# Patient Record
Sex: Female | Born: 1970 | Race: Black or African American | Hispanic: No | Marital: Single | Smoking: Current every day smoker
Health system: Southern US, Community
[De-identification: ages and names within clinical notes are randomized; demographics above are authoritative.]

## PROBLEM LIST (undated history)

## (undated) DIAGNOSIS — Z972 Presence of dental prosthetic device (complete) (partial): Secondary | ICD-10-CM

## (undated) DIAGNOSIS — K219 Gastro-esophageal reflux disease without esophagitis: Secondary | ICD-10-CM

## (undated) DIAGNOSIS — Z86018 Personal history of other benign neoplasm: Secondary | ICD-10-CM

## (undated) DIAGNOSIS — R109 Unspecified abdominal pain: Secondary | ICD-10-CM

## (undated) DIAGNOSIS — M797 Fibromyalgia: Secondary | ICD-10-CM

## (undated) DIAGNOSIS — F411 Generalized anxiety disorder: Secondary | ICD-10-CM

## (undated) DIAGNOSIS — G43909 Migraine, unspecified, not intractable, without status migrainosus: Secondary | ICD-10-CM

## (undated) DIAGNOSIS — Z9889 Other specified postprocedural states: Secondary | ICD-10-CM

## (undated) DIAGNOSIS — R011 Cardiac murmur, unspecified: Secondary | ICD-10-CM

## (undated) DIAGNOSIS — K08109 Complete loss of teeth, unspecified cause, unspecified class: Secondary | ICD-10-CM

## (undated) HISTORY — PX: FIBEROPTIC BRONCHOSCOPY: SHX5367

## (undated) HISTORY — PX: CHOLECYSTECTOMY: SHX55

## (undated) HISTORY — PX: TUBAL LIGATION: SHX77

## (undated) HISTORY — PX: OPEN PARTIAL HEPATECTOMY [83]: SHX5987

## (undated) HISTORY — PX: LIVER SURGERY: SHX698

---

## 1993-01-24 HISTORY — PX: TUBAL LIGATION: SHX77

## 1997-05-14 ENCOUNTER — Other Ambulatory Visit: Admission: RE | Admit: 1997-05-14 | Discharge: 1997-05-14 | Payer: Self-pay | Admitting: Family Medicine

## 1997-06-27 ENCOUNTER — Other Ambulatory Visit: Admission: RE | Admit: 1997-06-27 | Discharge: 1997-06-27 | Payer: Self-pay | Admitting: Family Medicine

## 1997-08-06 ENCOUNTER — Other Ambulatory Visit: Admission: RE | Admit: 1997-08-06 | Discharge: 1997-08-06 | Payer: Self-pay | Admitting: Family Medicine

## 1997-08-19 ENCOUNTER — Ambulatory Visit (HOSPITAL_COMMUNITY): Admission: RE | Admit: 1997-08-19 | Discharge: 1997-08-19 | Payer: Self-pay | Admitting: Family Medicine

## 1999-01-07 ENCOUNTER — Emergency Department (HOSPITAL_COMMUNITY): Admission: EM | Admit: 1999-01-07 | Discharge: 1999-01-07 | Payer: Self-pay | Admitting: Emergency Medicine

## 1999-09-13 ENCOUNTER — Emergency Department (HOSPITAL_COMMUNITY): Admission: EM | Admit: 1999-09-13 | Discharge: 1999-09-13 | Payer: Self-pay | Admitting: Emergency Medicine

## 2000-09-28 ENCOUNTER — Emergency Department (HOSPITAL_COMMUNITY): Admission: EM | Admit: 2000-09-28 | Discharge: 2000-09-28 | Payer: Self-pay | Admitting: Emergency Medicine

## 2001-03-02 ENCOUNTER — Encounter: Payer: Self-pay | Admitting: Family Medicine

## 2001-03-02 ENCOUNTER — Encounter: Admission: RE | Admit: 2001-03-02 | Discharge: 2001-03-02 | Payer: Self-pay | Admitting: Family Medicine

## 2001-05-05 ENCOUNTER — Emergency Department (HOSPITAL_COMMUNITY): Admission: EM | Admit: 2001-05-05 | Discharge: 2001-05-06 | Payer: Self-pay | Admitting: Emergency Medicine

## 2002-10-07 ENCOUNTER — Emergency Department (HOSPITAL_COMMUNITY): Admission: EM | Admit: 2002-10-07 | Discharge: 2002-10-07 | Payer: Self-pay | Admitting: Emergency Medicine

## 2003-03-01 ENCOUNTER — Emergency Department (HOSPITAL_COMMUNITY): Admission: EM | Admit: 2003-03-01 | Discharge: 2003-03-01 | Payer: Self-pay | Admitting: Emergency Medicine

## 2003-11-12 ENCOUNTER — Emergency Department (HOSPITAL_COMMUNITY): Admission: EM | Admit: 2003-11-12 | Discharge: 2003-11-13 | Payer: Self-pay | Admitting: *Deleted

## 2004-02-11 ENCOUNTER — Emergency Department (HOSPITAL_COMMUNITY): Admission: EM | Admit: 2004-02-11 | Discharge: 2004-02-12 | Payer: Self-pay

## 2004-02-13 ENCOUNTER — Ambulatory Visit (HOSPITAL_COMMUNITY): Admission: RE | Admit: 2004-02-13 | Discharge: 2004-02-13 | Payer: Self-pay

## 2004-02-13 ENCOUNTER — Emergency Department (HOSPITAL_COMMUNITY): Admission: EM | Admit: 2004-02-13 | Discharge: 2004-02-13 | Payer: Self-pay | Admitting: Emergency Medicine

## 2004-03-12 ENCOUNTER — Ambulatory Visit: Payer: Self-pay | Admitting: Internal Medicine

## 2004-03-30 ENCOUNTER — Ambulatory Visit (HOSPITAL_COMMUNITY): Admission: RE | Admit: 2004-03-30 | Discharge: 2004-03-30 | Payer: Self-pay | Admitting: Thoracic Surgery

## 2004-03-30 ENCOUNTER — Encounter (INDEPENDENT_AMBULATORY_CARE_PROVIDER_SITE_OTHER): Payer: Self-pay | Admitting: Specialist

## 2004-04-19 ENCOUNTER — Encounter: Admission: RE | Admit: 2004-04-19 | Discharge: 2004-04-19 | Payer: Self-pay | Admitting: Occupational Medicine

## 2004-05-20 ENCOUNTER — Ambulatory Visit: Payer: Self-pay | Admitting: Critical Care Medicine

## 2004-07-01 ENCOUNTER — Ambulatory Visit: Payer: Self-pay | Admitting: Critical Care Medicine

## 2004-09-10 ENCOUNTER — Ambulatory Visit: Payer: Self-pay | Admitting: Critical Care Medicine

## 2004-10-07 ENCOUNTER — Ambulatory Visit: Payer: Self-pay | Admitting: Critical Care Medicine

## 2004-10-26 ENCOUNTER — Ambulatory Visit: Payer: Self-pay | Admitting: Internal Medicine

## 2004-11-04 ENCOUNTER — Ambulatory Visit: Payer: Self-pay | Admitting: Gastroenterology

## 2004-11-08 ENCOUNTER — Ambulatory Visit: Payer: Self-pay | Admitting: Internal Medicine

## 2004-11-10 ENCOUNTER — Ambulatory Visit: Payer: Self-pay | Admitting: Cardiology

## 2005-03-25 ENCOUNTER — Emergency Department (HOSPITAL_COMMUNITY): Admission: EM | Admit: 2005-03-25 | Discharge: 2005-03-25 | Payer: Self-pay | Admitting: Family Medicine

## 2005-06-30 ENCOUNTER — Emergency Department (HOSPITAL_COMMUNITY): Admission: EM | Admit: 2005-06-30 | Discharge: 2005-06-30 | Payer: Self-pay | Admitting: Family Medicine

## 2005-11-03 ENCOUNTER — Emergency Department (HOSPITAL_COMMUNITY): Admission: EM | Admit: 2005-11-03 | Discharge: 2005-11-03 | Payer: Self-pay | Admitting: Family Medicine

## 2005-12-14 ENCOUNTER — Emergency Department (HOSPITAL_COMMUNITY): Admission: EM | Admit: 2005-12-14 | Discharge: 2005-12-14 | Payer: Self-pay | Admitting: Emergency Medicine

## 2006-01-05 ENCOUNTER — Emergency Department (HOSPITAL_COMMUNITY): Admission: EM | Admit: 2006-01-05 | Discharge: 2006-01-06 | Payer: Self-pay | Admitting: Emergency Medicine

## 2006-01-26 ENCOUNTER — Emergency Department (HOSPITAL_COMMUNITY): Admission: EM | Admit: 2006-01-26 | Discharge: 2006-01-26 | Payer: Self-pay | Admitting: Emergency Medicine

## 2006-03-07 ENCOUNTER — Ambulatory Visit (HOSPITAL_COMMUNITY): Admission: RE | Admit: 2006-03-07 | Discharge: 2006-03-07 | Payer: Self-pay | Admitting: Obstetrics

## 2006-10-16 ENCOUNTER — Emergency Department (HOSPITAL_COMMUNITY): Admission: EM | Admit: 2006-10-16 | Discharge: 2006-10-16 | Payer: Self-pay | Admitting: Family Medicine

## 2007-03-27 ENCOUNTER — Emergency Department (HOSPITAL_COMMUNITY): Admission: EM | Admit: 2007-03-27 | Discharge: 2007-03-27 | Payer: Self-pay | Admitting: Emergency Medicine

## 2007-04-10 ENCOUNTER — Ambulatory Visit (HOSPITAL_COMMUNITY): Admission: RE | Admit: 2007-04-10 | Discharge: 2007-04-10 | Payer: Self-pay | Admitting: Gastroenterology

## 2008-10-23 ENCOUNTER — Emergency Department (HOSPITAL_COMMUNITY): Admission: EM | Admit: 2008-10-23 | Discharge: 2008-10-23 | Payer: Self-pay | Admitting: Family Medicine

## 2008-11-15 IMAGING — NM NM HEPATO W/GB/PHARM/[PERSON_NAME]
3 series · 13 of 13 positions shown · non-contrast
Comparison: none

CLINICAL DATA: Hepatic hemangioma. Abdominal pain.

[Series 1: he hepatobiliary · 1 of 1 slices shown (1 of 3)]
[im 1/1]
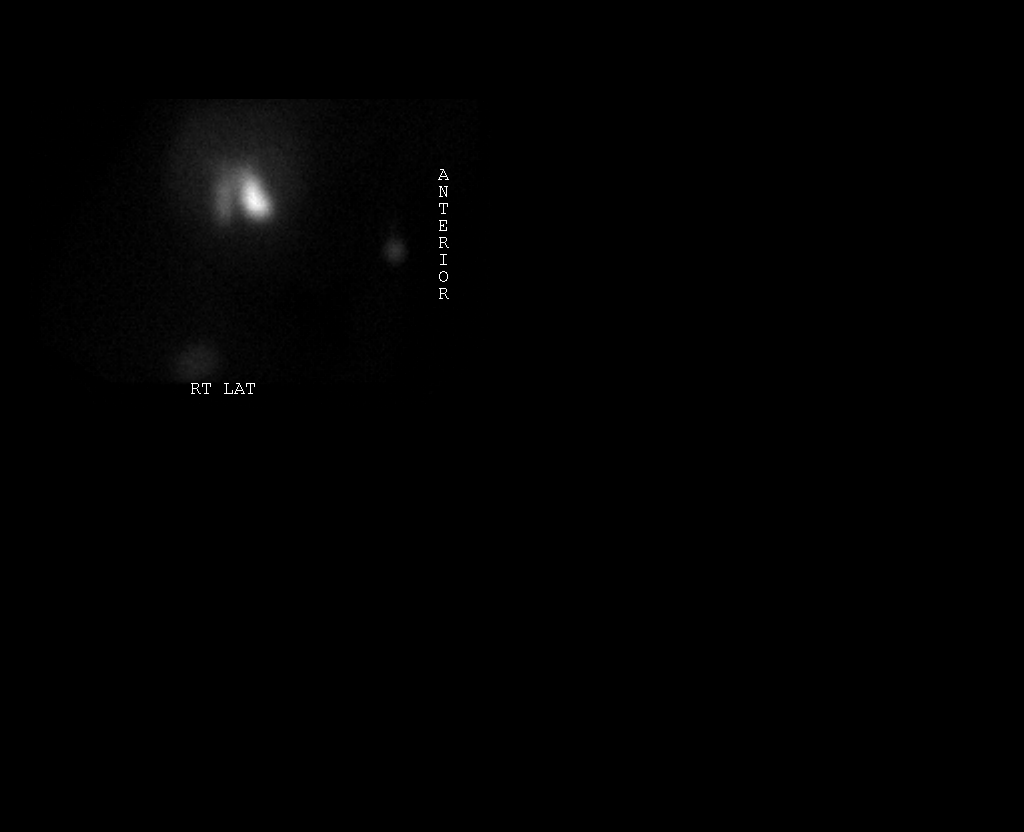

[Series 1: he hepatobiliary · 3.22mm/px · 6 of 51 frames shown (2 of 3)]
[frame 5/51]
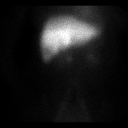
[frame 13/51]
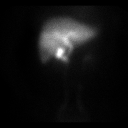
[frame 22/51]
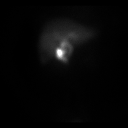
[frame 30/51]
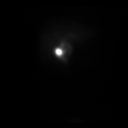
[frame 39/51]
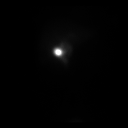
[frame 47/51]
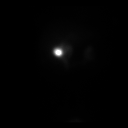

[Series 1: he hepatobiliary · 3.22mm/px · 6 of 12 frames shown (3 of 3)]
[frame 2/12]
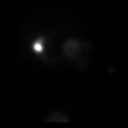
[frame 4/12]
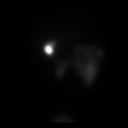
[frame 6/12]
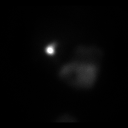
[frame 8/12]
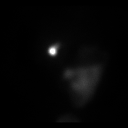
[frame 10/12]
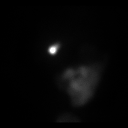
[frame 12/12]
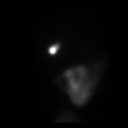

[13 of 13 positions shown; findings below may reference images not displayed]

Hepatobiliary scintigraphy with ejection fraction:

Anterior imaging after 5.3 mCi Ac00P Choletec IV. There is prompt clearance of
the radiopharmaceutical from the blood pool. Timely visualization of activity in
central bile ducts, small bowel, and gallbladder. 
After 1 hour, patient ingested 8 oz half-and-half p.o. The calculated
gallbladder ejection fraction over 60 minutes is  60%.
IMPRESSION: 1. Patency of cystic and common bile ducts.
2. Normal gallbladder ejection fraction.

## 2009-05-06 ENCOUNTER — Emergency Department (HOSPITAL_COMMUNITY): Admission: EM | Admit: 2009-05-06 | Discharge: 2009-05-06 | Payer: Self-pay | Admitting: Emergency Medicine

## 2009-07-07 ENCOUNTER — Emergency Department (HOSPITAL_COMMUNITY): Admission: EM | Admit: 2009-07-07 | Discharge: 2009-07-08 | Payer: Self-pay | Admitting: Emergency Medicine

## 2010-02-14 ENCOUNTER — Encounter: Payer: Self-pay | Admitting: Gastroenterology

## 2010-02-14 ENCOUNTER — Encounter: Payer: Self-pay | Admitting: Obstetrics

## 2010-03-11 ENCOUNTER — Inpatient Hospital Stay (INDEPENDENT_AMBULATORY_CARE_PROVIDER_SITE_OTHER)
Admission: RE | Admit: 2010-03-11 | Discharge: 2010-03-11 | Disposition: A | Discharge status: Home / Self Care | Payer: Self-pay | Source: Ambulatory Visit | Attending: Family Medicine | Admitting: Family Medicine

## 2010-03-11 DIAGNOSIS — R51 Headache: Secondary | ICD-10-CM

## 2010-04-12 LAB — POCT I-STAT, CHEM 8
Calcium, Ion: 1.06 mmol/L — ABNORMAL LOW (ref 1.12–1.32)
Glucose, Bld: 87 mg/dL (ref 70–99)
HCT: 42 % (ref 36.0–46.0)
Hemoglobin: 14.3 g/dL (ref 12.0–15.0)
Potassium: 3.5 mEq/L (ref 3.5–5.1)

## 2010-04-12 LAB — CK: Total CK: 160 U/L (ref 7–177)

## 2010-04-14 LAB — DIFFERENTIAL
Basophils Relative: 0 % (ref 0–1)
Eosinophils Absolute: 0 10*3/uL (ref 0.0–0.7)
Monocytes Absolute: 0.1 10*3/uL (ref 0.1–1.0)
Monocytes Relative: 2 % — ABNORMAL LOW (ref 3–12)

## 2010-04-14 LAB — COMPREHENSIVE METABOLIC PANEL
ALT: 17 U/L (ref 0–35)
Albumin: 3.9 g/dL (ref 3.5–5.2)
Alkaline Phosphatase: 75 U/L (ref 39–117)
Potassium: 3.6 mEq/L (ref 3.5–5.1)
Sodium: 138 mEq/L (ref 135–145)
Total Protein: 7.5 g/dL (ref 6.0–8.3)

## 2010-04-14 LAB — CBC
Hemoglobin: 14.1 g/dL (ref 12.0–15.0)
Platelets: 272 10*3/uL (ref 150–400)
RDW: 14.7 % (ref 11.5–15.5)

## 2010-04-30 LAB — COMPREHENSIVE METABOLIC PANEL
ALT: 11 U/L (ref 0–35)
AST: 23 U/L (ref 0–37)
Albumin: 3.5 g/dL (ref 3.5–5.2)
Alkaline Phosphatase: 61 U/L (ref 39–117)
GFR calc Af Amer: >60 mL/min (ref >60)
Glucose, Bld: 84 mg/dL (ref 70–99)
Potassium: 3.6 mEq/L (ref 3.5–5.1)
Sodium: 136 mEq/L (ref 135–145)
Total Protein: 6.8 g/dL (ref 6.0–8.3)

## 2010-04-30 LAB — POCT URINALYSIS DIP (DEVICE)
Nitrite: NEGATIVE
Urobilinogen, UA: 0.2 mg/dL (ref 0.0–1.0)
pH: 5.5 (ref 5.0–8.0)

## 2010-04-30 LAB — WET PREP, GENITAL: WBC, Wet Prep HPF POC: NONE SEEN

## 2010-04-30 LAB — DIFFERENTIAL
Basophils Absolute: 0 10*3/uL (ref 0.0–0.1)
Basophils Relative: 1 % (ref 0–1)
Eosinophils Relative: 2 % (ref 0–5)
Monocytes Absolute: 0.4 10*3/uL (ref 0.1–1.0)

## 2010-04-30 LAB — CBC
Hemoglobin: 12.8 g/dL (ref 12.0–15.0)
RBC: 3.68 MIL/uL — ABNORMAL LOW (ref 3.87–5.11)
RDW: 14.1 % (ref 11.5–15.5)

## 2010-04-30 LAB — GC/CHLAMYDIA PROBE AMP, GENITAL: Chlamydia, DNA Probe: NEGATIVE

## 2010-06-11 NOTE — Op Note (Signed)
NAMEARLETTE, SCHAAD                ACCOUNT NO.:  [id number]   MEDICAL RECORD NO.:  [id number]          PATIENT TYPE:  OIB   LOCATION:  2899                         FACILITY:  [facility]   PHYSICIAN:  Ines Bloomer, M.D. DATE OF BIRTH:  1970-06-25   DATE OF PROCEDURE:  03/30/2004  DATE OF DISCHARGE:                                 OPERATIVE REPORT   PREOPERATIVE DIAGNOSIS:  Mediastinal axillary and supraclavicular  adenopathy.   POSTOPERATIVE DIAGNOSIS:  Mediastinal axillary and supraclavicular  adenopathy, possible sarcoidosis.   OPERATION PERFORMED:  Fiberoptic bronchoscopy and left supraclavicular node  biopsy.   SURGEON:  Ines Bloomer, M.D.   ANESTHESIA:  General.   DESCRIPTION OF PROCEDURE:  After general anesthesia, the video bronchoscope  was passed through the endotracheal tube.  The carina was in midline.  The  right upper lobe, right middle lobe and right lower lobe orifices were  normal.  The left mainstem, left upper lobe and left lower lobe orifices  were normal.  Washings and cultures were taken from the bronchial washings.  Then the neck was prepped and draped in the usual sterile manner.  A  transverse incision was made above the left supraclavicular area and the  anterior cervical triangle and dissection carried down lateral to the  jugular splitting the sternocleidomastoid and there was a large amount of  nodes there which were excised with electrocautery and sharp and blunt  dissection.  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  Wounds were  closed with 2-0 Vicryl in the muscle area and 3-0 Vicryl in the subcutaneous  tissue and Dermabond for the skin.  The patient tolerated the procedure  well, returned to recovery room in stable condition.      DPB/MEDQ  D:  03/30/2004  T:  03/30/2004  Job:  [id number]

## 2010-06-11 NOTE — Letter (Signed)
March 12, 2006    Julie Valentine  [address]  [place], [place] [address]   RE:  [name], [name]  MRN:  [id number]  /  DOB:  11-10-1970   Dear Ms. Gurski:   You have scheduled appointments with me four times since November of  2006, but have failed to show for any of them.  I realize you have  significant medical problems and I would like to try to continue to help  you.   However, if you cannot keep your appointments we cannot really have an  effective patient-physician relationship.  I would ask that you make  another appointment with me if you are having problems that need my  evaluation.  If you fail to keep that appointment, I will plan to  discharge you from my practice due to noncompliance manifested by  failure to show up for appointments.    Sincerely,      Iva Boop, MD,FACG  Electronically Signed    CEG/MedQ  DD: 03/12/2006  DT: 03/12/2006  Job #: [id number]

## 2010-06-11 NOTE — Letter (Signed)
April 03, 2006    Ms Julie Valentine  [address]  [place], [place] [address]   RE:  [name], [name]  MRN:  [id number]  /  DOB:  1970/09/22   Dear [name]:   You recently missed yet another appointment that you had scheduled with  me. Unfortunately, due to multiple no shows, I am discharging you from  my practice. I will be available for emergencies over the next 30 days,  but due to your non-compliance with office visits, I think we are unable  to have an effective patient-physician relationship.    Sincerely,      Iva Boop, MD,FACG  Electronically Signed    CEG/MedQ  DD: 04/02/2006  DT: 04/03/2006  Job #: [id number]

## 2010-10-20 ENCOUNTER — Other Ambulatory Visit (HOSPITAL_COMMUNITY): Payer: Self-pay | Admitting: Obstetrics

## 2010-10-20 DIAGNOSIS — Z1231 Encounter for screening mammogram for malignant neoplasm of breast: Secondary | ICD-10-CM

## 2010-10-25 ENCOUNTER — Ambulatory Visit (HOSPITAL_COMMUNITY)
Admission: RE | Admit: 2010-10-25 | Discharge: 2010-10-25 | Disposition: A | Discharge status: Home / Self Care | Payer: Medicaid Other | Source: Ambulatory Visit | Attending: Obstetrics | Admitting: Obstetrics

## 2010-10-25 DIAGNOSIS — Z1231 Encounter for screening mammogram for malignant neoplasm of breast: Secondary | ICD-10-CM | POA: Insufficient documentation

## 2010-10-29 ENCOUNTER — Other Ambulatory Visit: Payer: Self-pay | Admitting: Obstetrics

## 2010-10-29 DIAGNOSIS — R928 Other abnormal and inconclusive findings on diagnostic imaging of breast: Secondary | ICD-10-CM

## 2010-11-13 ENCOUNTER — Emergency Department (HOSPITAL_COMMUNITY)
Admission: EM | Admit: 2010-11-13 | Discharge: 2010-11-14 | Disposition: A | Discharge status: Home / Self Care | Payer: Medicaid Other | Attending: Emergency Medicine | Admitting: Emergency Medicine

## 2010-11-13 DIAGNOSIS — F329 Major depressive disorder, single episode, unspecified: Secondary | ICD-10-CM | POA: Insufficient documentation

## 2010-11-13 DIAGNOSIS — K219 Gastro-esophageal reflux disease without esophagitis: Secondary | ICD-10-CM | POA: Insufficient documentation

## 2010-11-13 DIAGNOSIS — R079 Chest pain, unspecified: Secondary | ICD-10-CM | POA: Insufficient documentation

## 2010-11-13 DIAGNOSIS — F411 Generalized anxiety disorder: Secondary | ICD-10-CM | POA: Insufficient documentation

## 2010-11-13 DIAGNOSIS — F3289 Other specified depressive episodes: Secondary | ICD-10-CM | POA: Insufficient documentation

## 2010-11-13 DIAGNOSIS — K209 Esophagitis, unspecified without bleeding: Secondary | ICD-10-CM | POA: Insufficient documentation

## 2010-11-14 ENCOUNTER — Emergency Department (HOSPITAL_COMMUNITY): Payer: Medicaid Other

## 2010-11-14 LAB — COMPREHENSIVE METABOLIC PANEL
Albumin: 4.1 g/dL (ref 3.5–5.2)
Alkaline Phosphatase: 82 U/L (ref 39–117)
BUN: 8 mg/dL (ref 6–23)
Potassium: 4 mEq/L (ref 3.5–5.1)
Sodium: 138 mEq/L (ref 135–145)
Total Protein: 8.4 g/dL — ABNORMAL HIGH (ref 6.0–8.3)

## 2010-11-14 LAB — TROPONIN I
Troponin I: <0.3 ng/mL (ref <0.30)
Troponin I: <0.3 ng/mL (ref <0.30)

## 2010-11-14 LAB — CBC
HCT: 39.5 % (ref 36.0–46.0)
MCHC: 34.2 g/dL (ref 30.0–36.0)
Platelets: 349 10*3/uL (ref 150–400)
RDW: 13.7 % (ref 11.5–15.5)

## 2010-11-14 LAB — CK TOTAL AND CKMB (NOT AT ARMC)
CK, MB: 1.4 ng/mL (ref 0.3–4.0)
Total CK: 110 U/L (ref 7–177)
Total CK: 86 U/L (ref 7–177)

## 2010-11-14 LAB — DIFFERENTIAL
Basophils Absolute: 0.1 10*3/uL (ref 0.0–0.1)
Basophils Relative: 1 % (ref 0–1)
Eosinophils Absolute: 0.2 10*3/uL (ref 0.0–0.7)
Eosinophils Relative: 3 % (ref 0–5)
Monocytes Absolute: 0.4 10*3/uL (ref 0.1–1.0)

## 2010-11-14 LAB — URINALYSIS, ROUTINE W REFLEX MICROSCOPIC
Ketones, ur: NEGATIVE mg/dL
Urobilinogen, UA: 0.2 mg/dL (ref 0.0–1.0)
pH: 6 (ref 5.0–8.0)

## 2010-12-06 ENCOUNTER — Other Ambulatory Visit: Payer: Self-pay | Admitting: Obstetrics

## 2010-12-06 ENCOUNTER — Ambulatory Visit
Admission: RE | Admit: 2010-12-06 | Discharge: 2010-12-06 | Disposition: A | Discharge status: Home / Self Care | Payer: Medicaid Other | Source: Ambulatory Visit | Attending: Obstetrics | Admitting: Obstetrics

## 2010-12-06 DIAGNOSIS — R928 Other abnormal and inconclusive findings on diagnostic imaging of breast: Secondary | ICD-10-CM

## 2011-04-12 ENCOUNTER — Encounter (HOSPITAL_COMMUNITY): Payer: Self-pay

## 2011-04-12 ENCOUNTER — Inpatient Hospital Stay (HOSPITAL_COMMUNITY): Payer: Medicaid Other

## 2011-04-12 ENCOUNTER — Encounter (HOSPITAL_COMMUNITY): Payer: Self-pay | Admitting: *Deleted

## 2011-04-12 ENCOUNTER — Emergency Department (HOSPITAL_COMMUNITY)
Admission: EM | Admit: 2011-04-12 | Discharge: 2011-04-12 | Disposition: A | Discharge status: Home / Self Care | Payer: Medicaid Other | Source: Home / Self Care | Attending: Emergency Medicine | Admitting: Emergency Medicine

## 2011-04-12 ENCOUNTER — Inpatient Hospital Stay (HOSPITAL_COMMUNITY)
Admission: AD | Admit: 2011-04-12 | Discharge: 2011-04-12 | Disposition: A | Discharge status: Home / Self Care | Payer: Medicaid Other | Source: Ambulatory Visit | Attending: Obstetrics | Admitting: Obstetrics

## 2011-04-12 DIAGNOSIS — D219 Benign neoplasm of connective and other soft tissue, unspecified: Secondary | ICD-10-CM

## 2011-04-12 DIAGNOSIS — N949 Unspecified condition associated with female genital organs and menstrual cycle: Secondary | ICD-10-CM

## 2011-04-12 DIAGNOSIS — N739 Female pelvic inflammatory disease, unspecified: Secondary | ICD-10-CM | POA: Insufficient documentation

## 2011-04-12 DIAGNOSIS — N939 Abnormal uterine and vaginal bleeding, unspecified: Secondary | ICD-10-CM

## 2011-04-12 DIAGNOSIS — R102 Pelvic and perineal pain: Secondary | ICD-10-CM

## 2011-04-12 DIAGNOSIS — N730 Acute parametritis and pelvic cellulitis: Secondary | ICD-10-CM

## 2011-04-12 DIAGNOSIS — R109 Unspecified abdominal pain: Secondary | ICD-10-CM | POA: Insufficient documentation

## 2011-04-12 DIAGNOSIS — B9689 Other specified bacterial agents as the cause of diseases classified elsewhere: Secondary | ICD-10-CM | POA: Insufficient documentation

## 2011-04-12 DIAGNOSIS — D259 Leiomyoma of uterus, unspecified: Secondary | ICD-10-CM

## 2011-04-12 DIAGNOSIS — N760 Acute vaginitis: Secondary | ICD-10-CM

## 2011-04-12 DIAGNOSIS — N898 Other specified noninflammatory disorders of vagina: Secondary | ICD-10-CM

## 2011-04-12 DIAGNOSIS — A499 Bacterial infection, unspecified: Secondary | ICD-10-CM

## 2011-04-12 LAB — URINALYSIS, ROUTINE W REFLEX MICROSCOPIC
Bilirubin Urine: NEGATIVE
Hgb urine dipstick: NEGATIVE
Specific Gravity, Urine: 1.015 (ref 1.005–1.030)
pH: 7 (ref 5.0–8.0)

## 2011-04-12 LAB — CBC
Hemoglobin: 12.5 g/dL (ref 12.0–15.0)
MCH: 33.1 pg (ref 26.0–34.0)
MCV: 97.4 fL (ref 78.0–100.0)
RBC: 3.78 MIL/uL — ABNORMAL LOW (ref 3.87–5.11)
WBC: 4.1 10*3/uL (ref 4.0–10.5)

## 2011-04-12 LAB — POCT PREGNANCY, URINE: Preg Test, Ur: NEGATIVE

## 2011-04-12 MED ORDER — METRONIDAZOLE 500 MG PO TABS: 500.0000 mg | ORAL_TABLET | Freq: Two times a day (BID) | ORAL | Status: AC

## 2011-04-12 MED ORDER — KETOROLAC TROMETHAMINE 60 MG/2ML IM SOLN
60.0000 mg | Freq: Once | INTRAMUSCULAR | Status: AC
  Administered 2011-04-12: 60 mg via INTRAMUSCULAR
  Filled 2011-04-12: qty 2

## 2011-04-12 MED ORDER — AZITHROMYCIN 250 MG PO TABS
1000.0000 mg | ORAL_TABLET | Freq: Once | ORAL | Status: AC
  Administered 2011-04-12: 1000 mg via ORAL
  Filled 2011-04-12: qty 4

## 2011-04-12 MED ORDER — OXYCODONE-ACETAMINOPHEN 5-325 MG PO TABS
2.0000 | ORAL_TABLET | ORAL | Status: AC | PRN
Start: 2011-04-12 — End: 2011-04-22

## 2011-04-12 MED ORDER — HYDROCODONE-ACETAMINOPHEN 5-325 MG PO TABS
ORAL_TABLET | ORAL | Status: AC
  Filled 2011-04-12: qty 1

## 2011-04-12 MED ORDER — HYDROCODONE-ACETAMINOPHEN 5-325 MG PO TABS
ORAL_TABLET | ORAL | Status: AC
  Filled 2011-04-12: qty 2

## 2011-04-12 MED ORDER — CEFTRIAXONE SODIUM 250 MG IJ SOLR
250.0000 mg | Freq: Once | INTRAMUSCULAR | Status: AC
  Administered 2011-04-12: 250 mg via INTRAMUSCULAR
  Filled 2011-04-12: qty 250

## 2011-04-12 MED ORDER — DOXYCYCLINE HYCLATE 100 MG PO TABS: 100.0000 mg | ORAL_TABLET | Freq: Two times a day (BID) | ORAL | Status: AC

## 2011-04-12 MED ORDER — HYDROCODONE-ACETAMINOPHEN 5-325 MG PO TABS
2.0000 | ORAL_TABLET | Freq: Once | ORAL | Status: AC
  Administered 2011-04-12: 2 via ORAL

## 2011-04-12 NOTE — ED Notes (Signed)
C/o low abdominal and back pain with nausea.  States worse on lt than rt.  Denies urinary sx, vaginal sx, diarrhea or vomitiing.  States she has had 18 days straight of vaginal bleeding that decreased to spotting yesterday and has totally stopped today.  Reports she has been experiencing discomfort but the pain became so much worse since the bleeding has ceased.  Reports Dr Gaynell Face advised her to go to ED last night but she declined- called them again today and they told her to come here.

## 2011-04-12 NOTE — MAU Note (Signed)
States bled for 18 days with last period. Now spotting again.

## 2011-04-12 NOTE — ED Provider Notes (Signed)
History     CSN: [id number]  Arrival date & time 04/12/11  1002   First MD Initiated Contact with Patient 04/12/11 1053      Chief Complaint  Patient presents with  . Abdominal Pain  . Back Pain    (Consider location/radiation/quality/duration/timing/severity/associated sxs/prior treatment) HPI Comments: Patient presents urgent care and she is having severe left lower abdominal pain for the last couple of days it has been much worse since yesterday. She described that she's been having an ongoing straight vaginal bleeding for about 18 days and yesterday was fairly spotting and it has stopped today but pain has intensified on her left lower pelvic region. Patient call Dr. Gaynell Face last night was instructed to present herself to the emergency department she was having intense pain. Patient also describes that she was feeling tired decided not to go and cold this morning again and was instructed palpation by Dr. Elsie Stain nurse to present herself here at urgent care for further evaluation of her pain.  Patient denies any fevers, vomiting, diarrheas.  Patient is a 41 y.o. female presenting with abdominal pain and back pain. The history is provided by the patient.  Abdominal Pain The primary symptoms of the illness include abdominal pain, nausea and vaginal bleeding. The primary symptoms of the illness do not include fever, fatigue, vomiting, diarrhea, dysuria or vaginal discharge. The current episode started more than 2 days ago. The problem has been gradually worsening.  The patient states that she believes she is currently not pregnant. The patient has not had a change in bowel habit. Additional symptoms associated with the illness include anorexia and back pain. Symptoms associated with the illness do not include urgency, hematuria or frequency. Significant associated medical issues do not include substance abuse, diverticulitis or HIV.  Back Pain  Associated symptoms include abdominal pain.  Pertinent negatives include no fever and no dysuria.    History reviewed. No pertinent past medical history.  Past Surgical History  Procedure Date  . Liver surgery     History reviewed. No pertinent family history.  History  Substance Use Topics  . Smoking status: Current Everyday Smoker  . Smokeless tobacco: Not on file  . Alcohol Use: Yes    OB History    Grav Para Term Preterm Abortions TAB SAB Ect Mult Living                  Review of Systems  Constitutional: Negative for fever and fatigue.  Gastrointestinal: Positive for nausea, abdominal pain and anorexia. Negative for vomiting and diarrhea.  Genitourinary: Positive for vaginal bleeding. Negative for dysuria, urgency, frequency, hematuria and vaginal discharge.  Musculoskeletal: Positive for back pain.    Allergies  Review of patient's allergies indicates no known allergies.  Home Medications  No current outpatient prescriptions on file.  BP 114/81  Pulse 92  Temp(Src) 98.1 F (36.7 C) (Oral)  Resp 19  SpO2 98%  LMP 03/24/2011  Physical Exam  Nursing note and vitals reviewed. Constitutional: She appears well-developed and well-nourished. No distress.  HENT:  Head: Normocephalic.  Eyes: Conjunctivae are normal.  Abdominal: Soft. She exhibits no mass. There is tenderness. There is no rebound and no guarding. Hernia confirmed negative in the right inguinal area and confirmed negative in the left inguinal area.  Genitourinary: There is no rash, tenderness, lesion or injury on the right labia. There is no rash, tenderness, lesion or injury on the left labia. Uterus is enlarged and tender. Cervix exhibits no discharge and  no friability. Left adnexum displays tenderness and fullness. Left adnexum displays no mass. No tenderness or bleeding around the vagina. Vaginal discharge found.    ED Course  Procedures (including critical care time)   Labs Reviewed  POCT PREGNANCY, URINE  GC/CHLAMYDIA PROBE AMP,  GENITAL  WET PREP, GENITAL   No results found.   1. Pelvic pain   2. Vaginal bleeding, abnormal       MDM  Patient has been experiencing lower pelvic pains and metromenorrhagia for about 18 days. Vaginal bleeding has subsided with within the last 48 hours but left adnexal pain and fullness was palpated on pelvic exam. Patient has been indicated to present herself to the MAU at once. I have called preemptively alerting staff discuss case with nurse practitioner rationale for transfer and that I would alert Dr. Gaynell Face that she is on her way to have a transvaginal ultrasound.        Jimmie Molly, MD 04/12/11 1241

## 2011-04-12 NOTE — MAU Note (Signed)
Abdominal pain, all across lower part, but more to left since yesterday. Went to urgent care. Had  -UPT, did wet prep. Advised to come to MAU. States has had BTL.

## 2011-04-12 NOTE — Discharge Instructions (Signed)
Bacterial Vaginosis Bacterial vaginosis (BV) is a vaginal infection where the normal balance of bacteria in the vagina is disrupted. The normal balance is then replaced by an overgrowth of certain bacteria. There are several different kinds of bacteria that can cause BV. BV is the most common vaginal infection in women of childbearing age. CAUSES   The cause of BV is not fully understood. BV develops when there is an increase or imbalance of harmful bacteria.   Some activities or behaviors can upset the normal balance of bacteria in the vagina and put women at increased risk including:   Having a new sex partner or multiple sex partners.   Douching.   Using an intrauterine device (IUD) for contraception.   It is not clear what role sexual activity plays in the development of BV. However, women that have never had sexual intercourse are rarely infected with BV.  Women do not get BV from toilet seats, bedding, swimming pools or from touching objects around them.  SYMPTOMS   Grey vaginal discharge.   A fish-like odor with discharge, especially after sexual intercourse.   Itching or burning of the vagina and vulva.   Burning or pain with urination.   Some women have no signs or symptoms at all.  DIAGNOSIS  Your caregiver must examine the vagina for signs of BV. Your caregiver will perform lab tests and look at the sample of vaginal fluid through a microscope. They will look for bacteria and abnormal cells (clue cells), a pH test higher than 4.5, and a positive amine test all associated with BV.  RISKS AND COMPLICATIONS   Pelvic inflammatory disease (PID).   Infections following gynecology surgery.   Developing HIV.   Developing herpes virus.  TREATMENT  Sometimes BV will clear up without treatment. However, all women with symptoms of BV should be treated to avoid complications, especially if gynecology surgery is planned. Female partners generally do not need to be treated. However,  BV may spread between female sex partners so treatment is helpful in preventing a recurrence of BV.   BV may be treated with antibiotics. The antibiotics come in either pill or vaginal cream forms. Either can be used with nonpregnant or pregnant women, but the recommended dosages differ. These antibiotics are not harmful to the baby.   BV can recur after treatment. If this happens, a second round of antibiotics will often be prescribed.   Treatment is important for pregnant women. If not treated, BV can cause a premature delivery, especially for a pregnant woman who had a premature birth in the past. All pregnant women who have symptoms of BV should be checked and treated.   For chronic reoccurrence of BV, treatment with a type of prescribed gel vaginally twice a week is helpful.  HOME CARE INSTRUCTIONS   Finish all medication as directed by your caregiver.   Do not have sex until treatment is completed.   Tell your sexual partner that you have a vaginal infection. They should see their caregiver and be treated if they have problems, such as a mild rash or itching.   Practice safe sex. Use condoms. Only have 1 sex partner.  PREVENTION  Basic prevention steps can help reduce the risk of upsetting the natural balance of bacteria in the vagina and developing BV:  Do not have sexual intercourse (be abstinent).   Do not douche.   Use all of the medicine prescribed for treatment of BV, even if the signs and symptoms go away.     Tell your sex partner if you have BV. That way, they can be treated, if needed, to prevent reoccurrence.  SEEK MEDICAL CARE IF:   Your symptoms are not improving after 3 days of treatment.   You have increased discharge, pain, or fever.  MAKE SURE YOU:   Understand these instructions.   Will watch your condition.   Will get help right away if you are not doing well or get worse.  FOR MORE INFORMATION  Division of STD Prevention (DSTDP), Centers for Disease  Control and Prevention: www.cdc.gov/std American Social Health Association (ASHA): www.ashastd.org  Document Released: 01/10/2005 Document Revised: 12/30/2010 Document Reviewed: 07/03/2008 [employer] Patient Information 2012 [employer]. 

## 2011-04-12 NOTE — Discharge Instructions (Signed)
Has discuss you have  Dr. Elsie Stain office since last night and they have recommended you to go to the emergency department. Your exam certainly suggests that we need further evaluate your area of discomfort with an ultrasound [facility] of your sure of arrival and I will also call Dr. Elsie Stain again to let them know that you are being evaluated at [facility] hospital. Please go directly to [facility] as they are expecting her arrival for further evaluation we discussed several diagnostic possibilities and need to be ruled out   Abdominal Pain, Women Abdominal (stomach, pelvic, or belly) pain can be caused by many things. It is important to tell your doctor:  The location of the pain.   Does it come and go or is it present all the time?   Are there things that start the pain (eating certain foods, exercise)?   Are there other symptoms associated with the pain (fever, nausea, vomiting, diarrhea)?  All of this is helpful to know when trying to find the cause of the pain. CAUSES   Stomach: virus or bacteria infection, or ulcer.   Intestine: appendicitis (inflamed appendix), regional ileitis (Crohn's disease), ulcerative colitis (inflamed colon), irritable bowel syndrome, diverticulitis (inflamed diverticulum of the colon), or cancer of the stomach or intestine.   Gallbladder disease or stones in the gallbladder.   Kidney disease, kidney stones, or infection.   Pancreas infection or cancer.   Fibromyalgia (pain disorder).   Diseases of the female organs:   Uterus: fibroid (non-cancerous) tumors or infection.   Fallopian tubes: infection or tubal pregnancy.   Ovary: cysts or tumors.   Pelvic adhesions (scar tissue).   Endometriosis (uterus lining tissue growing in the pelvis and on the pelvic organs).   Pelvic congestion syndrome (female organs filling up with blood just before the menstrual period).   Pain with the menstrual period.    Pain with ovulation (producing an egg).   Pain with an IUD (intrauterine device, birth control) in the uterus.   Cancer of the female organs.   Functional pain (pain not caused by a disease, may improve without treatment).   Psychological pain.   Depression.  DIAGNOSIS  Your doctor will decide the seriousness of your pain by doing an examination.  Blood tests.   X-rays.   Ultrasound.   CT scan (computed tomography, special type of X-ray).   MRI (magnetic resonance imaging).   Cultures, for infection.   Barium enema (dye inserted in the large intestine, to better view it with X-rays).   Colonoscopy (looking in intestine with a lighted tube).   Laparoscopy (minor surgery, looking in abdomen with a lighted tube).   Major abdominal exploratory surgery (looking in abdomen with a large incision).  TREATMENT  The treatment will depend on the cause of the pain.   Many cases can be observed and treated at home.   Over-the-counter medicines recommended by your caregiver.   Prescription medicine.   Antibiotics, for infection.   Birth control pills, for painful periods or for ovulation pain.   Hormone treatment, for endometriosis.   Nerve blocking injections.   Physical therapy.   Antidepressants.   Counseling with a psychologist or psychiatrist.   Minor or major surgery.  HOME CARE INSTRUCTIONS   Do not take laxatives, unless directed by your caregiver.   Take over-the-counter pain medicine only if ordered by your caregiver. Do not take aspirin because it can cause an upset stomach or bleeding.  Try a clear liquid diet (broth or water) as ordered by your caregiver. Slowly move to a bland diet, as tolerated, if the pain is related to the stomach or intestine.   Have a thermometer and take your temperature several times a day, and record it.   Bed rest and sleep, if it helps the pain.   Avoid sexual intercourse, if it causes pain.   Avoid stressful  situations.   Keep your follow-up appointments and tests, as your caregiver orders.   If the pain does not go away with medicine or surgery, you may try:   Acupuncture.   Relaxation exercises (yoga, meditation).   Group therapy.   Counseling.  SEEK MEDICAL CARE IF:   You notice certain foods cause stomach pain.   Your home care treatment is not helping your pain.   You need stronger pain medicine.   You want your IUD removed.   You feel faint or lightheaded.   You develop nausea and vomiting.   You develop a rash.   You are having side effects or an allergy to your medicine.  SEEK IMMEDIATE MEDICAL CARE IF:   Your pain does not go away or gets worse.   You have a fever.   Your pain is felt only in portions of the abdomen. The right side could possibly be appendicitis. The left lower portion of the abdomen could be colitis or diverticulitis.   You are passing blood in your stools (bright red or black tarry stools, with or without vomiting).   You have blood in your urine.   You develop chills, with or without a fever.   You pass out.  MAKE SURE YOU:   Understand these instructions.   Will watch your condition.   Will get help right away if you are not doing well or get worse.  Document Released: 11/07/2006 Document Revised: 12/30/2010 Document Reviewed: 11/27/2008 [facility] Patient Information 2012 [place], [place].

## 2011-04-12 NOTE — MAU Provider Note (Signed)
History     CSN: [id number]  Arrival date and time: 04/12/11 1315   First Provider Initiated Contact with Patient 04/12/11 1351      Chief Complaint  Patient presents with  . Abdominal Pain   HPI  Pt is not pregnant and presents with LLQ and  Back pain with nausea worsening since last night and after bleeding stopped 2 days ago.  Pt was seen a [facility] for lower abdominal pain and Dr. Gaynell Face.re contacted. Pt's exam revealed adnexal fullness and tenderness.  Pt is here for further evaluation.  Pt was given Norco at urgent care without any relief.  Pt states she has had RCM until 03/24/2011 when she bled for 28 days and now is having spotting. Pt denies constipation, diarrhea, UTI symptoms, or hx of previous symptoms.  Pt denies fever or chills.  No past medical history on file.  Past Surgical History  Procedure Date  . Liver surgery     No family history on file.  History  Substance Use Topics  . Smoking status: Current Everyday Smoker  . Smokeless tobacco: Not on file  . Alcohol Use: Yes    Allergies: No Known Allergies  Prescriptions prior to admission  Medication Sig Dispense Refill  . acetaminophen (TYLENOL) 500 MG tablet Take 500 mg by mouth every 6 (six) hours as needed. For pain.      Marland Kitchen Omeprazole (PRILOSEC PO) Take 1 capsule by mouth daily as needed. For heartburn.        Review of Systems  Constitutional: Negative for fever and chills.  Gastrointestinal: Positive for nausea and abdominal pain. Negative for vomiting, diarrhea and constipation.  Genitourinary: Negative for dysuria, urgency and frequency.   Physical Exam   Blood pressure 108/73, pulse 69, temperature 98.1 F (36.7 C), temperature source Oral, resp. rate 18, height 5\' 4"  (1.626 m), weight 170 lb 12.8 oz (77.474 kg), last menstrual period 03/24/2011.  Physical Exam  Vitals reviewed. Constitutional: She is oriented to person, place, and time. She appears well-developed and  well-nourished.       Uncomfortable appearing  HENT:  Head: Normocephalic.  Eyes: Pupils are equal, round, and reactive to light.  Neck: Normal range of motion. Neck supple.  Cardiovascular: Normal rate.   Respiratory: Effort normal.  GI: Soft. She exhibits no distension. There is tenderness. There is guarding. There is no rebound.       Neg flank pain  Genitourinary:       Pelvic at Urgent Care revealed left adnexal pain and fullness.  Wet prep showed clue cells.  GC/chlamdyia pending. Exam today- mod amount of frothy white discharge in vault(no bleeding noted); cervix parous, tender; left adnexal tenderness without appreciable enlargement; right adnexa without tenderness or palpable enlargement  Musculoskeletal: Normal range of motion.  Neurological: She is alert and oriented to person, place, and time.  Skin: Skin is warm and dry.  Psychiatric: She has a normal mood and affect.    MAU Course  Procedures Technique: Both transabdominal and transvaginal ultrasound  examinations of the pelvis were performed. Transabdominal  technique was performed for global imaging of the pelvis including  uterus, ovaries, adnexal regions, and pelvic cul-de-sac.  It was necessary to proceed with endovaginal exam following the  transabdominal exam to visualize the retroverted uterus and  endometrium.  Comparison: None.  Findings:  Uterus: 8.9 x 5.1 x 7.0 cm. Retroverted. An intramural fibroid  is seen in the left fundal region which measures 2.7 cm in maximum  diameter. Two smaller subserosal fibroids are seen in the left  posterior corpus which measure 2.1 cm and 1.3 cm in maximum  diameter.  Endometrium: Endometrial thickness measures 7 mm transvaginally.  Normal trilayered appearance.  Right ovary: 4.0 x 2.5 x 2.6 cm. Normal appearance.  Left ovary: 3.2 x 1.7 x 1.5 cm. Normal appearance.  Other Findings: Trace free fluid in pelvic cul-de-sac, which is  most likely physiologic in a  reproductive age female.  IMPRESSION:  1. Retroverted uterus, with several small left uterine fibroids  measuring up to 2.7 cm.  2. Normal ovaries. No adnexal mass identified.  Original Report Authenticated By: Danae Orleans, M.D.      Results for orders placed during the hospital encounter of 04/12/11 (from the past 24 hour(s))  URINALYSIS, ROUTINE W REFLEX MICROSCOPIC     Status: Normal   Collection Time   04/12/11  1:30 PM      Component Value Range   Color, Urine YELLOW  YELLOW    APPearance CLEAR  CLEAR    Specific Gravity, Urine 1.015  1.005 - 1.030    pH 7.0  5.0 - 8.0    Glucose, UA NEGATIVE  NEGATIVE (mg/dL)   Hgb urine dipstick NEGATIVE  NEGATIVE    Bilirubin Urine NEGATIVE  NEGATIVE    Ketones, ur NEGATIVE  NEGATIVE (mg/dL)   Protein, ur NEGATIVE  NEGATIVE (mg/dL)   Urobilinogen, UA 0.2  0.0 - 1.0 (mg/dL)   Nitrite NEGATIVE  NEGATIVE    Leukocytes, UA NEGATIVE  NEGATIVE   CBC     Status: Abnormal   Collection Time   04/12/11  2:00 PM      Component Value Range   WBC 4.1  4.0 - 10.5 (K/uL)   RBC 3.78 (*) 3.87 - 5.11 (MIL/uL)   Hemoglobin 12.5  12.0 - 15.0 (g/dL)   HCT 16.1  09.6 - 04.5 (%)   MCV 97.4  78.0 - 100.0 (fL)   MCH 33.1  26.0 - 34.0 (pg)   MCHC 34.0  30.0 - 36.0 (g/dL)   RDW 40.9  81.1 - 91.4 (%)   Platelets 305  150 - 400 (K/uL)  wet prep from [facility] showed clue cells GC/chlamdyia pending   Assessment and Plan  Abdominal pain fibroids ?PID- will treat empirically with Rocephin 250mg  IM and Zithromax 1 gm in MAU Doxycycline 100mg  BID for 10 days BV- Flagyl 500 mg BID for 7 days Roshawn Lacina 04/12/2011, 1:53 PM

## 2011-04-13 LAB — POCT URINALYSIS DIP (DEVICE)
Glucose, UA: NEGATIVE mg/dL
Hgb urine dipstick: NEGATIVE
Ketones, ur: NEGATIVE mg/dL
Specific Gravity, Urine: 1.02 (ref 1.005–1.030)
Urobilinogen, UA: 0.2 mg/dL (ref 0.0–1.0)

## 2011-04-13 LAB — GC/CHLAMYDIA PROBE AMP, GENITAL: GC Probe Amp, Genital: NEGATIVE

## 2011-04-13 NOTE — ED Notes (Signed)
GC/Chlamydia neg., Wet prep: Mod. Clue cells, few WBC's.  Pt. adequately treated with Flagyl. Vassie Moselle 04/13/2011

## 2011-05-11 ENCOUNTER — Emergency Department (HOSPITAL_COMMUNITY)
Admission: EM | Admit: 2011-05-11 | Discharge: 2011-05-11 | Disposition: A | Discharge status: Home / Self Care | Payer: Medicaid Other | Attending: Emergency Medicine | Admitting: Emergency Medicine

## 2011-05-11 ENCOUNTER — Emergency Department (HOSPITAL_COMMUNITY): Payer: Medicaid Other

## 2011-05-11 ENCOUNTER — Encounter (HOSPITAL_COMMUNITY): Payer: Self-pay | Admitting: Emergency Medicine

## 2011-05-11 DIAGNOSIS — R0789 Other chest pain: Secondary | ICD-10-CM

## 2011-05-11 DIAGNOSIS — R51 Headache: Secondary | ICD-10-CM | POA: Insufficient documentation

## 2011-05-11 DIAGNOSIS — R071 Chest pain on breathing: Secondary | ICD-10-CM | POA: Insufficient documentation

## 2011-05-11 LAB — BASIC METABOLIC PANEL
BUN: 6 mg/dL (ref 6–23)
CO2: 25 mEq/L (ref 19–32)
Calcium: 8.8 mg/dL (ref 8.4–10.5)
Chloride: 104 mEq/L (ref 96–112)
Creatinine, Ser: 0.88 mg/dL (ref 0.50–1.10)
Glucose, Bld: 85 mg/dL (ref 70–99)

## 2011-05-11 LAB — CBC
HCT: 36.5 % (ref 36.0–46.0)
MCH: 33.6 pg (ref 26.0–34.0)
MCV: 96.6 fL (ref 78.0–100.0)
Platelets: 306 10*3/uL (ref 150–400)
RBC: 3.78 MIL/uL — ABNORMAL LOW (ref 3.87–5.11)
WBC: 3.9 10*3/uL — ABNORMAL LOW (ref 4.0–10.5)

## 2011-05-11 LAB — TROPONIN I: Troponin I: <0.3 ng/mL (ref <0.30)

## 2011-05-11 MED ORDER — METOCLOPRAMIDE HCL 5 MG/ML IJ SOLN
10.0000 mg | Freq: Once | INTRAMUSCULAR | Status: AC
  Administered 2011-05-11: 10 mg via INTRAVENOUS
  Filled 2011-05-11: qty 2

## 2011-05-11 MED ORDER — DIPHENHYDRAMINE HCL 50 MG/ML IJ SOLN
25.0000 mg | Freq: Once | INTRAMUSCULAR | Status: AC
  Administered 2011-05-11: 25 mg via INTRAVENOUS
  Filled 2011-05-11: qty 1

## 2011-05-11 MED ORDER — SODIUM CHLORIDE 0.9 % IV SOLN
INTRAVENOUS | Status: DC
  Administered 2011-05-11: 1000 mL via INTRAVENOUS

## 2011-05-11 MED ORDER — SODIUM CHLORIDE 0.9 % IV BOLUS (SEPSIS)
1000.0000 mL | Freq: Once | INTRAVENOUS | Status: AC
  Administered 2011-05-11: 1000 mL via INTRAVENOUS

## 2011-05-11 MED ORDER — KETOROLAC TROMETHAMINE 30 MG/ML IJ SOLN
30.0000 mg | Freq: Once | INTRAMUSCULAR | Status: AC
  Administered 2011-05-11: 30 mg via INTRAVENOUS
  Filled 2011-05-11: qty 1

## 2011-05-11 MED ORDER — IBUPROFEN 600 MG PO TABS: 600.0000 mg | ORAL_TABLET | Freq: Four times a day (QID) | ORAL | Status: AC | PRN

## 2011-05-11 MED ORDER — METHOCARBAMOL 500 MG PO TABS: ORAL_TABLET | ORAL | Status: AC

## 2011-05-11 NOTE — Discharge Instructions (Signed)
Heat to your chest, don't burn your skin. Take the medications for pain. Recheck if you get a fever, your cough gets worse or you cough up yellow or green mucus or you feel your heart skipping or racing.

## 2011-05-11 NOTE — ED Provider Notes (Signed)
History     CSN: [id number]  Arrival date & time 05/11/11  1624   First MD Initiated Contact with Patient 05/11/11 1748      Chief Complaint  Patient presents with  . Chest Pain    (Consider location/radiation/quality/duration/timing/severity/associated sxs/prior treatment) HPI  Patient relates when she woke up about 6 AM she had chest pain that felt "like someone sitting on my chest". She indicates the pain was in the center of her chest. Sometimes she gets a stiffness in her neck. Otherwise there is no radiation. She has nausea without vomiting. She feels short of breath and has a mild cough without fever. She states she had this before but the other time was worse and that was when she was having an irregular heartbeat. She states that was about 3 or 4 months ago and she was seen at [facility] cardiology. She states her pain was a 9 just prior to coming to the ER and currently is a 7/10. She states nothing makes it feel worse and nothing makes it feel better. She started getting a headache yesterday evening this in her temples and she has nausea without vomiting she denies blurred vision.  Patient just finished a course of doxycycline for infection in her tubes about 5 days ago, she states she's currently on Flagyl but she started 4 days ago for continued symptoms.  PCP Dr. Gaynell Face [facility] [place]  History reviewed. No pertinent past medical history.  Past Surgical History  Procedure Date  . Liver surgery   . Tubal ligation     No family history on file.  History  Substance Use Topics  . Smoking status: Current Everyday Smoker -- 0.5 packs/day  . Smokeless tobacco: Never Used  . Alcohol Use: Yes     Occas., social, special occas.  employed  OB History    Grav Para Term Preterm Abortions TAB SAB Ect Mult Living   4 4 4  0 0 0 0 0 0 4      Review of Systems  All other systems reviewed and are negative.    Allergies  Review of patient's allergies indicates no  known allergies.  Home Medications   Current Outpatient Rx  Name Route Sig Dispense Refill  . ACETAMINOPHEN-CODEINE #3 300-30 MG PO TABS Oral Take 1 tablet by mouth every 6 (six) hours as needed. For pain.    . ASPIRIN 325 MG PO TABS Oral Take 325 mg by mouth daily as needed. For pain.    Marland Kitchen METRONIDAZOLE 500 MG PO TABS Oral Take 500 mg by mouth 2 (two) times daily. Started 05/07/11 for 7 day course of therapy    . OMEPRAZOLE 20 MG PO CPDR Oral Take 20 mg by mouth daily as needed. For heartburn.    Marland Kitchen DOXYCYCLINE HYCLATE 100 MG PO TABS Oral Take 100 mg by mouth 2 (two) times daily.      BP 103/64  Pulse 71  Temp(Src) 98.2 F (36.8 C) (Oral)  Resp 18  SpO2 100%  LMP 04/24/2011  Vital signs normal    Physical Exam  Constitutional: She is oriented to person, place, and time. She appears well-developed and well-nourished.  Non-toxic appearance. She does not appear ill. No distress.  HENT:  Head: Normocephalic and atraumatic.  Right Ear: External ear normal.  Left Ear: External ear normal.  Nose: Nose normal. No mucosal edema or rhinorrhea.  Mouth/Throat: Oropharynx is clear and moist and mucous membranes are normal. No dental abscesses or uvula swelling.  Eyes:  Conjunctivae and EOM are normal. Pupils are equal, round, and reactive to light.  Neck: Normal range of motion and full passive range of motion without pain. Neck supple.  Cardiovascular: Normal rate, regular rhythm and normal heart sounds.  Exam reveals no gallop and no friction rub.   No murmur heard. Pulmonary/Chest: Effort normal and breath sounds normal. No respiratory distress. She has no wheezes. She has no rhonchi. She has no rales. She exhibits tenderness. She exhibits no crepitus.       Patient is tender to palpation along the lower costochondral junctions.  Abdominal: Soft. Normal appearance and bowel sounds are normal. She exhibits no distension. There is no tenderness. There is no rebound and no guarding.    Musculoskeletal: Normal range of motion. She exhibits no edema and no tenderness.       Moves all extremities well.   Neurological: She is alert and oriented to person, place, and time. She has normal strength. No cranial nerve deficit.  Skin: Skin is warm, dry and intact. No rash noted. No erythema. No pallor.  Psychiatric: She has a normal mood and affect. Her speech is normal and behavior is normal. Her mood appears not anxious.    ED Course  Procedures (including critical care time)   Medications  0.9 %  sodium chloride infusion (1000 mL Intravenous New Bag/Given 05/11/11 1828)  sodium chloride 0.9 % bolus 1,000 mL (1000 mL Intravenous Given 05/11/11 1828)  metoCLOPramide (REGLAN) injection 10 mg (10 mg Intravenous Given 05/11/11 1841)  diphenhydrAMINE (BENADRYL) injection 25 mg (25 mg Intravenous Given 05/11/11 1839)  ketorolac (TORADOL) 30 MG/ML injection 30 mg (30 mg Intravenous Given 05/11/11 1843)    Recheck 20:00 states her pain is better. Only one cardiac enzyme was done because she states the pain has been there constantly since 6 AM this morning.  Results for orders placed during the hospital encounter of 05/11/11  CBC      Component Value Range   WBC 3.9 (*) 4.0 - 10.5 (K/uL)   RBC 3.78 (*) 3.87 - 5.11 (MIL/uL)   Hemoglobin 12.7  12.0 - 15.0 (g/dL)   HCT 09.8  11.9 - 14.7 (%)   MCV 96.6  78.0 - 100.0 (fL)   MCH 33.6  26.0 - 34.0 (pg)   MCHC 34.8  30.0 - 36.0 (g/dL)   RDW 82.9  56.2 - 13.0 (%)   Platelets 306  150 - 400 (K/uL)  BASIC METABOLIC PANEL      Component Value Range   Sodium 137  135 - 145 (mEq/L)   Potassium 4.1  3.5 - 5.1 (mEq/L)   Chloride 104  96 - 112 (mEq/L)   CO2 25  19 - 32 (mEq/L)   Glucose, Bld 85  70 - 99 (mg/dL)   BUN 6  6 - 23 (mg/dL)   Creatinine, Ser 8.65  0.50 - 1.10 (mg/dL)   Calcium 8.8  8.4 - 78.4 (mg/dL)   GFR calc non Af Amer 81 (*) >90 (mL/min)   GFR calc Af Amer >90  >90 (mL/min)  TROPONIN I      Component Value Range    Troponin I <0.30  <0.30 (ng/mL)   Laboratory interpretation all normal   Dg Chest 2 View  05/11/2011  *RADIOLOGY REPORT*  Clinical Data: Chest pain, weakness and dizziness.  CHEST - 2 VIEW  Comparison: PA and lateral chest 11/14/2010.  Findings: Lungs are clear.  Heart size is normal.  No pneumothorax or effusion.  IMPRESSION: Negative  chest.  Original Report Authenticated By: Bernadene Bell. Maricela Curet, M.D.    Date: 05/11/2011  Rate: 87  Rhythm: normal sinus rhythm  QRS Axis: normal  Intervals: normal  ST/T Wave abnormalities: normal  Conduction Disutrbances:none  Narrative Interpretation:   Old EKG Reviewed: none available    1. Chest wall pain   2. Headache     New Prescriptions   IBUPROFEN (ADVIL,MOTRIN) 600 MG TABLET    Take 1 tablet (600 mg total) by mouth every 6 (six) hours as needed for pain.   METHOCARBAMOL (ROBAXIN) 500 MG TABLET    Take 1 or 2 po Q 6hrs for pain   Plan discharge Devoria Albe, MD, Armando Gang   MDM          Ward Givens, MD 05/11/11 2021

## 2011-05-11 NOTE — ED Notes (Signed)
Pt c/o of chest pain, headache, and dizziness that started today at 6am.

## 2011-05-11 NOTE — ED Notes (Signed)
Duplicate documentation of 1000 NS

## 2012-01-11 ENCOUNTER — Encounter (HOSPITAL_COMMUNITY): Payer: Self-pay | Admitting: *Deleted

## 2012-01-11 ENCOUNTER — Emergency Department (INDEPENDENT_AMBULATORY_CARE_PROVIDER_SITE_OTHER)
Admission: EM | Admit: 2012-01-11 | Discharge: 2012-01-11 | Disposition: A | Discharge status: Home / Self Care | Payer: Self-pay | Source: Home / Self Care | Attending: Family Medicine | Admitting: Family Medicine

## 2012-01-11 DIAGNOSIS — S0003XA Contusion of scalp, initial encounter: Secondary | ICD-10-CM

## 2012-01-11 DIAGNOSIS — S0083XA Contusion of other part of head, initial encounter: Secondary | ICD-10-CM

## 2012-01-11 MED ORDER — TOBRAMYCIN 0.3 % OP SOLN
1.0000 [drp] | Freq: Four times a day (QID) | OPHTHALMIC | Status: DC
  Administered 2012-01-11 (×2): 1 [drp] via OPHTHALMIC

## 2012-01-11 MED ORDER — TOBRAMYCIN 0.3 % OP SOLN
OPHTHALMIC | Status: AC
  Filled 2012-01-11: qty 5

## 2012-01-11 NOTE — ED Provider Notes (Addendum)
History     CSN: [id number]  Arrival date & time 01/11/12  1846   None     Chief Complaint  Patient presents with  . Facial Pain    (Consider location/radiation/quality/duration/timing/severity/associated sxs/prior treatment) Patient is a 41 y.o. female presenting with facial injury. The history is provided by the patient.  Facial Injury  The incident occurred more than 2 days ago. The incident occurred at work. The injury mechanism was a direct blow (drink rack at [employer] fell and struck left orbit., c/o pain .). There is an injury to the left eye. The pain is mild. Pertinent negatives include no visual disturbance, no nausea, no vomiting, no headaches, no light-headedness and no loss of consciousness.    History reviewed. No pertinent past medical history.  Past Surgical History  Procedure Date  . Liver surgery   . Tubal ligation     No family history on file.  History  Substance Use Topics  . Smoking status: Current Every Day Smoker -- 0.5 packs/day  . Smokeless tobacco: Never Used  . Alcohol Use: Yes     Comment: Occas., social, special occas.    OB History    Grav Para Term Preterm Abortions TAB SAB Ect Mult Living   4 4 4  0 0 0 0 0 0 4      Review of Systems  Constitutional: Negative.   HENT: Negative.   Eyes: Positive for pain. Negative for photophobia, discharge, redness, itching and visual disturbance.  Gastrointestinal: Negative for nausea and vomiting.  Neurological: Negative for loss of consciousness, light-headedness and headaches.    Allergies  Review of patient's allergies indicates no known allergies.  Home Medications   Current Outpatient Rx  Name  Route  Sig  Dispense  Refill  . ACETAMINOPHEN-CODEINE #3 300-30 MG PO TABS   Oral   Take 1 tablet by mouth every 6 (six) hours as needed. For pain.         . ASPIRIN 325 MG PO TABS   Oral   Take 325 mg by mouth daily as needed. For pain.         Marland Kitchen DOXYCYCLINE HYCLATE 100 MG  PO TABS   Oral   Take 100 mg by mouth 2 (two) times daily.         Marland Kitchen METRONIDAZOLE 500 MG PO TABS   Oral   Take 500 mg by mouth 2 (two) times daily. Started 05/07/11 for 7 day course of therapy         . OMEPRAZOLE 20 MG PO CPDR   Oral   Take 20 mg by mouth daily as needed. For heartburn.           BP 113/81  Pulse 82  Temp 98.2 F (36.8 C) (Oral)  Resp 16  SpO2 100%  LMP 12/25/2011  Physical Exam  Nursing note and vitals reviewed. Constitutional: She is oriented to person, place, and time. She appears well-developed and well-nourished.  HENT:  Head: Normocephalic.  Right Ear: External ear normal.  Left Ear: External ear normal.  Mouth/Throat: Oropharynx is clear and moist.  Eyes: Conjunctivae normal and EOM are normal. Pupils are equal, round, and reactive to light. Right eye exhibits no discharge. Left eye exhibits no discharge.  Fundoscopic exam:      The left eye shows no hemorrhage and no papilledema. The left eye shows red reflex and venous pulsations. Slit lamp exam:      The left eye shows no corneal abrasion, no  hyphema, no fluorescein uptake and no anterior chamber bulge.    Neurological: She is alert and oriented to person, place, and time.  Skin: Skin is warm and dry.    ED Course  Procedures (including critical care time)  Labs Reviewed - No data to display No results found.   1. Facial contusion       MDM          Linna Hoff, MD 01/11/12 1610  Linna Hoff, MD 01/13/12 1120

## 2012-01-11 NOTE — ED Notes (Signed)
Pt   Requesting  rx  For  Pain  Dr  Artis Flock  Returned  And  Spoke  With  Pt      Pt  Was  Given  Ice  Pack   And    Informed        Of  The      Way   The   Directions  Of the  tobrex     Pt  Was   Advised    Take  Tylenol  For  Pain

## 2012-01-11 NOTE — ED Notes (Signed)
inj  l  Side  Face pt  Reports  She  Was  Struck  l  Side  Face   Near  The  Eye  When  She  Pulled  A  Drink  Off  The rack at a  [employer]  3  Days  Ago   She  Reports  She  Has  Been  Applying ice  To  The  Affected  Area   And  She  Reports  Blurred  Vision

## 2012-03-13 ENCOUNTER — Inpatient Hospital Stay (HOSPITAL_COMMUNITY)
Admission: AD | Admit: 2012-03-13 | Discharge: 2012-03-13 | Disposition: A | Discharge status: Home / Self Care | Payer: BC Managed Care – PPO | Source: Ambulatory Visit | Attending: Obstetrics | Admitting: Obstetrics

## 2012-03-13 ENCOUNTER — Inpatient Hospital Stay (HOSPITAL_COMMUNITY): Payer: BC Managed Care – PPO

## 2012-03-13 DIAGNOSIS — D219 Benign neoplasm of connective and other soft tissue, unspecified: Secondary | ICD-10-CM

## 2012-03-13 DIAGNOSIS — N831 Corpus luteum cyst of ovary, unspecified side: Secondary | ICD-10-CM | POA: Insufficient documentation

## 2012-03-13 DIAGNOSIS — R109 Unspecified abdominal pain: Secondary | ICD-10-CM | POA: Insufficient documentation

## 2012-03-13 DIAGNOSIS — N949 Unspecified condition associated with female genital organs and menstrual cycle: Secondary | ICD-10-CM | POA: Insufficient documentation

## 2012-03-13 DIAGNOSIS — D259 Leiomyoma of uterus, unspecified: Secondary | ICD-10-CM

## 2012-03-13 LAB — URINALYSIS, ROUTINE W REFLEX MICROSCOPIC
Bilirubin Urine: NEGATIVE
Ketones, ur: NEGATIVE mg/dL
Nitrite: NEGATIVE
Protein, ur: NEGATIVE mg/dL
Specific Gravity, Urine: >1.03 — ABNORMAL HIGH (ref 1.005–1.030)
Urobilinogen, UA: 0.2 mg/dL (ref 0.0–1.0)

## 2012-03-13 LAB — CBC
HCT: 36.2 % (ref 36.0–46.0)
Hemoglobin: 12.8 g/dL (ref 12.0–15.0)
MCH: 33.6 pg (ref 26.0–34.0)
MCV: 95 fL (ref 78.0–100.0)
RBC: 3.81 MIL/uL — ABNORMAL LOW (ref 3.87–5.11)

## 2012-03-13 LAB — COMPREHENSIVE METABOLIC PANEL
BUN: 11 mg/dL (ref 6–23)
CO2: 25 mEq/L (ref 19–32)
Calcium: 8.8 mg/dL (ref 8.4–10.5)
Chloride: 106 mEq/L (ref 96–112)
Creatinine, Ser: 0.98 mg/dL (ref 0.50–1.10)
GFR calc Af Amer: 82 mL/min — ABNORMAL LOW (ref >90)
GFR calc non Af Amer: 71 mL/min — ABNORMAL LOW (ref >90)
Glucose, Bld: 109 mg/dL — ABNORMAL HIGH (ref 70–99)
Total Bilirubin: 0.3 mg/dL (ref 0.3–1.2)

## 2012-03-13 LAB — WET PREP, GENITAL: Trich, Wet Prep: NONE SEEN

## 2012-03-13 MED ORDER — KETOROLAC TROMETHAMINE 60 MG/2ML IM SOLN
60.0000 mg | Freq: Once | INTRAMUSCULAR | Status: AC
  Administered 2012-03-13: 60 mg via INTRAMUSCULAR
  Filled 2012-03-13: qty 2

## 2012-03-13 MED ORDER — ONDANSETRON 8 MG PO TBDP
8.0000 mg | ORAL_TABLET | Freq: Once | ORAL | Status: AC
  Administered 2012-03-13: 8 mg via ORAL
  Filled 2012-03-13: qty 1

## 2012-03-13 MED ORDER — IBUPROFEN 800 MG PO TABS: 800.0000 mg | ORAL_TABLET | Freq: Three times a day (TID) | ORAL | Status: DC

## 2012-03-13 MED ORDER — ONDANSETRON 8 MG PO TBDP: 8.0000 mg | ORAL_TABLET | Freq: Three times a day (TID) | ORAL | Status: DC | PRN

## 2012-03-13 MED ORDER — ACETAMINOPHEN-CODEINE #3 300-30 MG PO TABS: 1.0000 | ORAL_TABLET | Freq: Four times a day (QID) | ORAL | Status: DC | PRN

## 2012-03-13 NOTE — MAU Provider Note (Signed)
History     CSN: [id number]  Arrival date and time: 03/13/12 2005   None     Chief Complaint  Patient presents with  . Abdominal Pain  . Urinary Frequency   HPI  Pt is not pregnant and presents with lower abdominal pain radiating towards her back. Pt hurts worse sitting and laying flat.  Her temp 99.7 this morning.  She has been nauseated, no vomiting. She had a normal bowel movement last night.  She has a hx of hemangioma with surgery in 2002.  She has a history of bilateral tubal ligation with LMP 02/08/2012.  Pt last saw Dr. Gaynell Face in December for annual.  Pt had intercourse this morning with only small amount of pain. Pt denies UTI symptoms, constipation or diarrhea. Pt states Tylenol does not help pain. Pt denies abnormal vaginal discharge or vaginal pain.  Pain not associated with eating. No past medical history on file.  Past Surgical History  Procedure Laterality Date  . Liver surgery    . Tubal ligation      No family history on file.  History  Substance Use Topics  . Smoking status: Current Every Day Smoker -- 0.50 packs/day  . Smokeless tobacco: Never Used  . Alcohol Use: Yes     Comment: Occas., social, special occas.    Allergies: No Known Allergies  Prescriptions prior to admission  Medication Sig Dispense Refill  . acetaminophen (TYLENOL) 325 MG tablet Take 325 mg by mouth every 6 (six) hours as needed for pain. Stomach pain      . omeprazole (PRILOSEC) 20 MG capsule Take 20 mg by mouth daily as needed. For heartburn.      . [DISCONTINUED] acetaminophen-codeine (TYLENOL #3) 300-30 MG per tablet Take 1 tablet by mouth every 6 (six) hours as needed. For pain.      . [DISCONTINUED] aspirin 325 MG tablet Take 325 mg by mouth daily as needed. For pain.      . [DISCONTINUED] doxycycline (VIBRA-TABS) 100 MG tablet Take 100 mg by mouth 2 (two) times daily.      . [DISCONTINUED] metroNIDAZOLE (FLAGYL) 500 MG tablet Take 500 mg by mouth 2 (two) times daily. Started  05/07/11 for 7 day course of therapy        Review of Systems  Constitutional: Negative for fever and chills.  Respiratory: Negative for hemoptysis.   Cardiovascular: Negative for chest pain.  Gastrointestinal: Positive for nausea and abdominal pain. Negative for vomiting, diarrhea and constipation.  Genitourinary: Negative for dysuria and urgency.  Musculoskeletal: Positive for back pain.       Low back left pain-    Physical Exam   Blood pressure 110/85, pulse 88, temperature 98.1 F (36.7 C), temperature source Oral, resp. rate 18, height 5\' 4"  (1.626 m), weight 175 lb (79.379 kg), last menstrual period 03/10/2012, SpO2 100.00%.  Physical Exam  Nursing note and vitals reviewed. Constitutional: She is oriented to person, place, and time. She appears well-developed and well-nourished.  HENT:  Head: Normocephalic.  Eyes: Pupils are equal, round, and reactive to light.  Neck: Normal range of motion. Neck supple.  Cardiovascular: Normal rate.   Respiratory: Effort normal.  GI: Soft. Bowel sounds are normal. She exhibits no distension. There is tenderness. There is no rebound and no guarding.  LLQ more tender than RLQ  Genitourinary: Vagina normal and uterus normal.  Small amount of creamy white discharge in vault; cervix clean; uterus NSSC, left adnexal tenderness with palpation- no rebound; left adnexal  mild tenderness- no rebound  Musculoskeletal: Normal range of motion.  Neurological: She is alert and oriented to person, place, and time.  Skin: Skin is warm and dry.  Psychiatric: She has a normal mood and affect.    MAU Course  Procedures CBC CMET Lipase Wet prep GC/Chlamydia- pending Pelvic ultrasound Results for orders placed during the hospital encounter of 03/13/12 (from the past 24 hour(s))  URINALYSIS, ROUTINE W REFLEX MICROSCOPIC     Status: Abnormal   Collection Time    03/13/12  8:18 PM      Result Value Range   Color, Urine YELLOW  YELLOW   APPearance CLEAR   CLEAR   Specific Gravity, Urine >1.030 (*) 1.005 - 1.030   pH 5.5  5.0 - 8.0   Glucose, UA NEGATIVE  NEGATIVE mg/dL   Hgb urine dipstick NEGATIVE  NEGATIVE   Bilirubin Urine NEGATIVE  NEGATIVE   Ketones, ur NEGATIVE  NEGATIVE mg/dL   Protein, ur NEGATIVE  NEGATIVE mg/dL   Urobilinogen, UA 0.2  0.0 - 1.0 mg/dL   Nitrite NEGATIVE  NEGATIVE   Leukocytes, UA NEGATIVE  NEGATIVE  CBC     Status: Abnormal   Collection Time    03/13/12  9:35 PM      Result Value Range   WBC 6.1  4.0 - 10.5 K/uL   RBC 3.81 (*) 3.87 - 5.11 MIL/uL   Hemoglobin 12.8  12.0 - 15.0 g/dL   HCT 16.1  09.6 - 04.5 %   MCV 95.0  78.0 - 100.0 fL   MCH 33.6  26.0 - 34.0 pg   MCHC 35.4  30.0 - 36.0 g/dL   RDW 40.9  81.1 - 91.4 %   Platelets 288  150 - 400 K/uL  COMPREHENSIVE METABOLIC PANEL     Status: Abnormal   Collection Time    03/13/12  9:35 PM      Result Value Range   Sodium 139  135 - 145 mEq/L   Potassium 4.2  3.5 - 5.1 mEq/L   Chloride 106  96 - 112 mEq/L   CO2 25  19 - 32 mEq/L   Glucose, Bld 109 (*) 70 - 99 mg/dL   BUN 11  6 - 23 mg/dL   Creatinine, Ser 7.82  0.50 - 1.10 mg/dL   Calcium 8.8  8.4 - 95.6 mg/dL   Total Protein 6.9  6.0 - 8.3 g/dL   Albumin 3.5  3.5 - 5.2 g/dL   AST 17  0 - 37 U/L   ALT 13  0 - 35 U/L   Alkaline Phosphatase 67  39 - 117 U/L   Total Bilirubin 0.3  0.3 - 1.2 mg/dL   GFR calc non Af Amer 71 (*) >90 mL/min   GFR calc Af Amer 82 (*) >90 mL/min  WET PREP, GENITAL     Status: Abnormal   Collection Time    03/13/12  9:55 PM      Result Value Range   Yeast Wet Prep HPF POC NONE SEEN  NONE SEEN   Trich, Wet Prep NONE SEEN  NONE SEEN   Clue Cells Wet Prep HPF POC FEW (*) NONE SEEN   WBC, Wet Prep HPF POC FEW (*) NONE SEEN  POCT PREGNANCY, URINE     Status: None   Collection Time    03/13/12 10:05 PM      Result Value Range   Preg Test, Ur NEGATIVE  NEGATIVE       *RADIOLOGY  REPORT*  Clinical Data: Pelvic pain  TRANSABDOMINAL AND TRANSVAGINAL ULTRASOUND OF PELVIS   Technique: Both transabdominal and transvaginal ultrasound  examinations of the pelvis were performed. Transabdominal technique  was performed for global imaging of the pelvis including uterus,  ovaries, adnexal regions, and pelvic cul-de-sac.  It was necessary to proceed with endovaginal exam following the  transabdominal exam to visualize the endometrium and adnexa.  Comparison: 04/12/2011  Findings:  Uterus: Measures 8.2 x 4.3 x 6.6 cm. There are three fibroids.  The largest is located along the left margin/fundal junction,  measuring 3.6 x 2.7 x 2.7 cm. Centered intramural however extends  to abut endometrium without significant distortion. A second  intramural fibroid along the left wall measures 1.4 x 1.3 cm. The  third is a subserosal fibroid measuring 1.5 cm, arising  posteriorly.  Endometrium: Normal in thickness at 5 mm.  Right ovary: Normal sonographic appearance, measuring 3.2 x 1.6 x  1.3 cm.  Left ovary: Measures 2.9 x 1.9 x 1.8 cm, containing a corpus luteal  cyst.  Other findings: Small amount of free fluid.  IMPRESSION:  Fibroid uterus as above.  Small amount of free fluid is likely physiologic.  Original Report Authenticated By: Jearld Lesch, M.D.     Assessment and Plan  Pelvic pain Fibroids CLC NSAID for pain and f/u with Dr. Ellin Goodie 03/13/2012, 9:23 PM

## 2012-03-13 NOTE — MAU Note (Signed)
Pt reports that she is having "real bad pain in the bottom of my stomach", reports pain x 24 hours and is worsening. Denies vaginal bleeding or discharge. Reports frequency but denies dysuria.

## 2012-03-13 NOTE — MAU Note (Signed)
As I was discharging the pt-she told me she fell in the parking lot and that her rt wrist is a little sore-I told her tht we would evaluate it and she sttes she is ready to go home now and states'it's just a litte sore,but is OK-she does not want it to be evaluated

## 2012-03-14 LAB — GC/CHLAMYDIA PROBE AMP
CT Probe RNA: NEGATIVE
GC Probe RNA: NEGATIVE

## 2012-03-20 ENCOUNTER — Emergency Department (HOSPITAL_COMMUNITY)
Admission: EM | Admit: 2012-03-20 | Discharge: 2012-03-20 | Disposition: A | Discharge status: Home / Self Care | Payer: BC Managed Care – PPO | Source: Home / Self Care | Attending: Family Medicine | Admitting: Family Medicine

## 2012-03-20 ENCOUNTER — Emergency Department (INDEPENDENT_AMBULATORY_CARE_PROVIDER_SITE_OTHER): Payer: BC Managed Care – PPO

## 2012-03-20 ENCOUNTER — Encounter (HOSPITAL_COMMUNITY): Payer: Self-pay | Admitting: Emergency Medicine

## 2012-03-20 DIAGNOSIS — S63601A Unspecified sprain of right thumb, initial encounter: Secondary | ICD-10-CM

## 2012-03-20 DIAGNOSIS — S6390XA Sprain of unspecified part of unspecified wrist and hand, initial encounter: Secondary | ICD-10-CM

## 2012-03-20 NOTE — ED Notes (Signed)
Reports right hand pain.  Reports at 11.30 pm, patient was trying to move a bed, fell backwards landing on right hand, hyper extending hand/wrist.  Pain in right wrist and portion of hand including the thumb.  Patient has swelling and reports shooting pain going up right arm

## 2012-03-20 NOTE — ED Provider Notes (Signed)
History     CSN: [id number]  Arrival date & time 03/20/12  1054   First MD Initiated Contact with Patient 03/20/12 1116      Chief Complaint  Patient presents with  . Hand Pain    (Consider location/radiation/quality/duration/timing/severity/associated sxs/prior treatment) Patient is a 42 y.o. female presenting with hand pain. The history is provided by the patient.  Hand Pain This is a new problem. The current episode started 6 to 12 hours ago (fell backwards at home last eve landing on right hand.). The problem has not changed since onset.   History reviewed. No pertinent past medical history.  Past Surgical History  Procedure Laterality Date  . Liver surgery    . Tubal ligation      No family history on file.  History  Substance Use Topics  . Smoking status: Current Every Day Smoker -- 0.50 packs/day  . Smokeless tobacco: Never Used  . Alcohol Use: Yes     Comment: Occas., social, special occas.    OB History   Grav Para Term Preterm Abortions TAB SAB Ect Mult Living   4 4 4  0 0 0 0 0 0 4      Review of Systems  Constitutional: Negative.   Musculoskeletal: Positive for joint swelling.  Skin: Negative.     Allergies  Review of patient's allergies indicates no known allergies.  Home Medications   Current Outpatient Rx  Name  Route  Sig  Dispense  Refill  . acetaminophen (TYLENOL) 325 MG tablet   Oral   Take 325 mg by mouth every 6 (six) hours as needed for pain. Stomach pain         . acetaminophen-codeine (TYLENOL #3) 300-30 MG per tablet   Oral   Take 1 tablet by mouth every 6 (six) hours as needed. For pain.   30 tablet   0   . ibuprofen (ADVIL,MOTRIN) 800 MG tablet   Oral   Take 1 tablet (800 mg total) by mouth 3 (three) times daily.   21 tablet   0   . omeprazole (PRILOSEC) 20 MG capsule   Oral   Take 20 mg by mouth daily as needed. For heartburn.         . ondansetron (ZOFRAN ODT) 8 MG disintegrating tablet   Oral   Take 1  tablet (8 mg total) by mouth every 8 (eight) hours as needed for nausea.   20 tablet   0     BP 101/60  Pulse 74  Temp(Src) 98.5 F (36.9 C) (Oral)  Resp 16  SpO2 100%  LMP 02/21/2012  Physical Exam  Nursing note and vitals reviewed. Constitutional: She is oriented to person, place, and time. She appears well-developed and well-nourished.  Musculoskeletal: She exhibits tenderness.       Hands: Neurological: She is alert and oriented to person, place, and time.  Skin: Skin is warm and dry.    ED Course  Procedures (including critical care time)  Labs Reviewed - No data to display No results found.   1. Sprain of right thumb, initial encounter       MDM          Linna Hoff, MD 03/20/12 1249

## 2012-07-05 ENCOUNTER — Other Ambulatory Visit (HOSPITAL_COMMUNITY): Payer: Self-pay | Admitting: Obstetrics & Gynecology

## 2012-07-05 DIAGNOSIS — Z9851 Tubal ligation status: Secondary | ICD-10-CM

## 2012-07-11 ENCOUNTER — Ambulatory Visit (HOSPITAL_COMMUNITY)
Admission: RE | Admit: 2012-07-11 | Discharge: 2012-07-11 | Disposition: A | Discharge status: Home / Self Care | Payer: BC Managed Care – PPO | Source: Ambulatory Visit | Attending: Obstetrics & Gynecology | Admitting: Obstetrics & Gynecology

## 2012-07-11 DIAGNOSIS — Z9851 Tubal ligation status: Secondary | ICD-10-CM | POA: Insufficient documentation

## 2012-07-11 MED ORDER — IOHEXOL 300 MG/ML  SOLN
20.0000 mL | Freq: Once | INTRAMUSCULAR | Status: AC | PRN
  Administered 2012-07-11: 4 mL

## 2012-11-17 IMAGING — US US TRANSVAGINAL NON-OB
1 series · 13 of 25 positions shown · non-contrast
Comparison: None.

CLINICAL DATA: Left lower quadrant pain.  Abnormal uterine
bleeding.

TRANSABDOMINAL AND TRANSVAGINAL ULTRASOUND OF PELVIS
TECHNIQUE: Both transabdominal and transvaginal ultrasound
examinations of the pelvis were performed..  Transabdominal
technique was performed for global imaging of the pelvis including
uterus, ovaries, adnexal regions, and pelvic cul-de-sac.
It was necessary to proceed with endovaginal exam following the
transabdominal exam to visualize the retroverted uterus and
endometrium.

[Series 1: us pelvis complete · 13 of 72 slices shown]
[im 1/72]
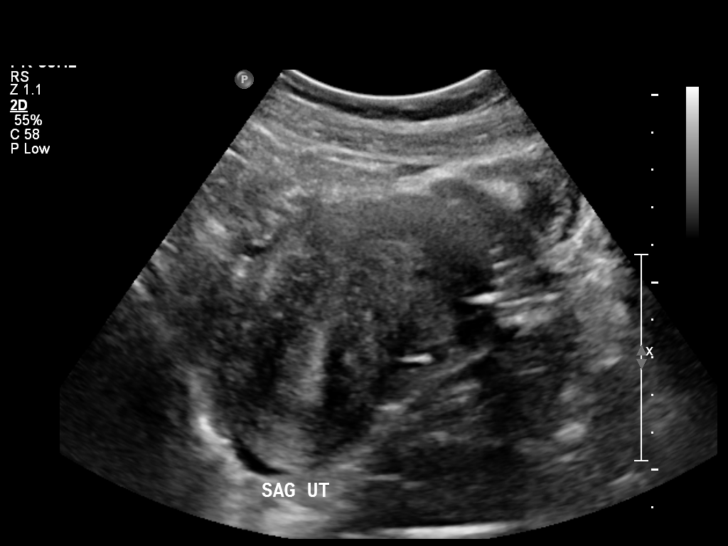
[im 6/72]
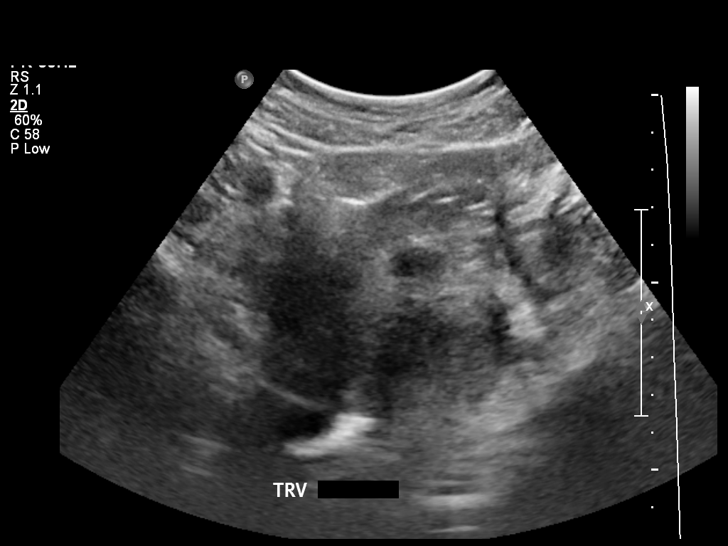
[im 12/72]
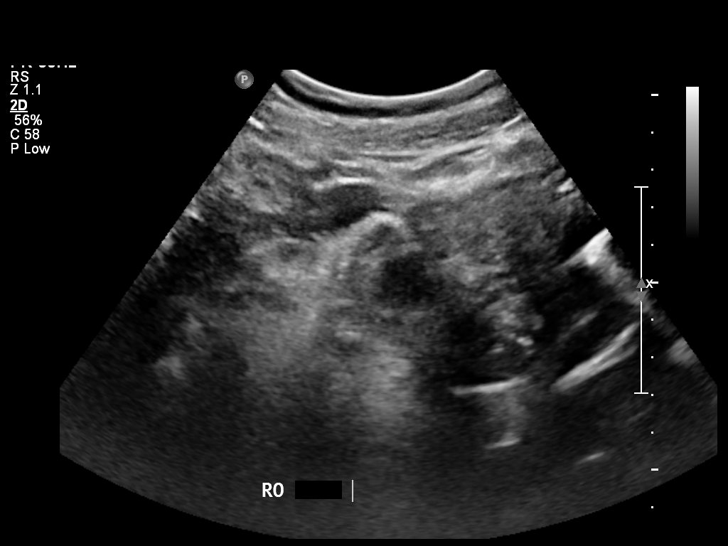
[im 18/72]
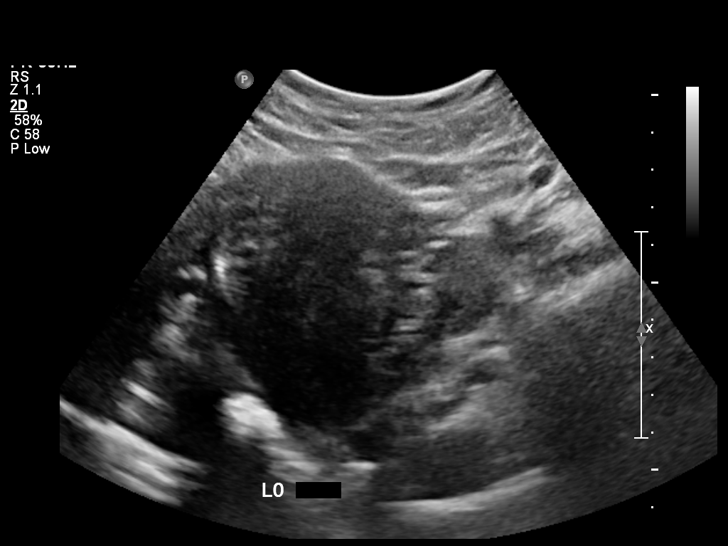
[im 24/72]
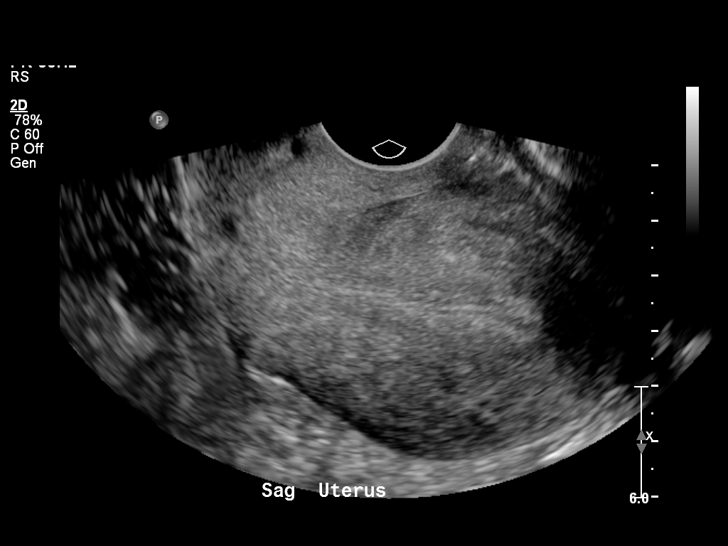
[im 30/72]
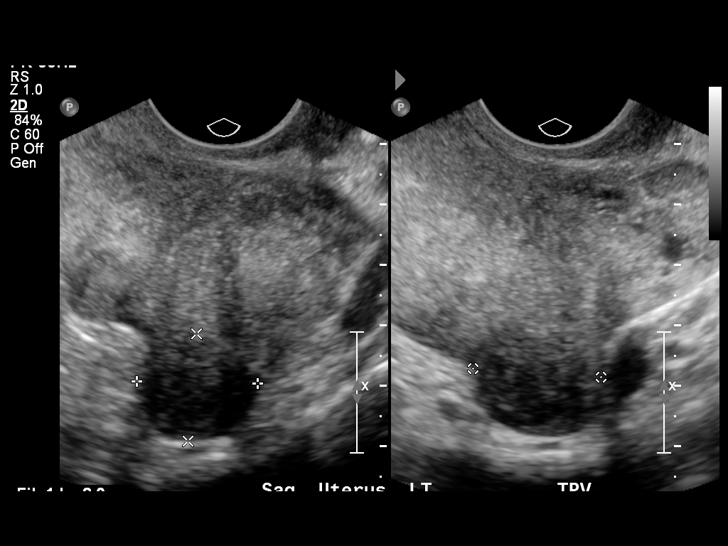
[im 36/72]
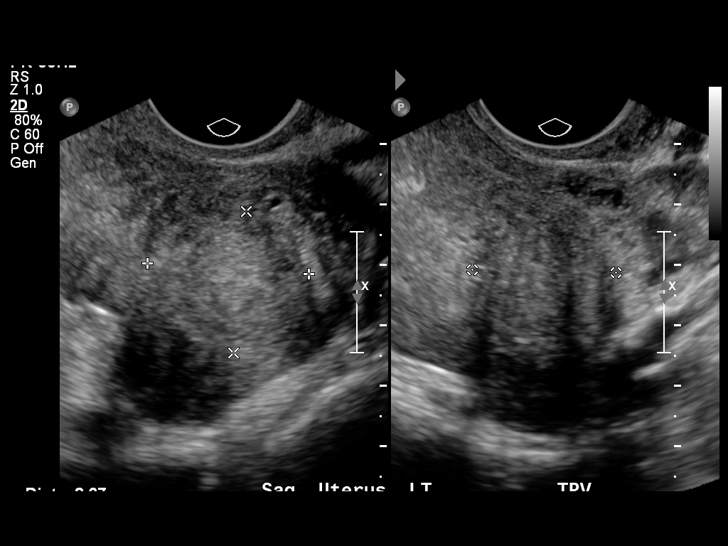
[im 42/72]
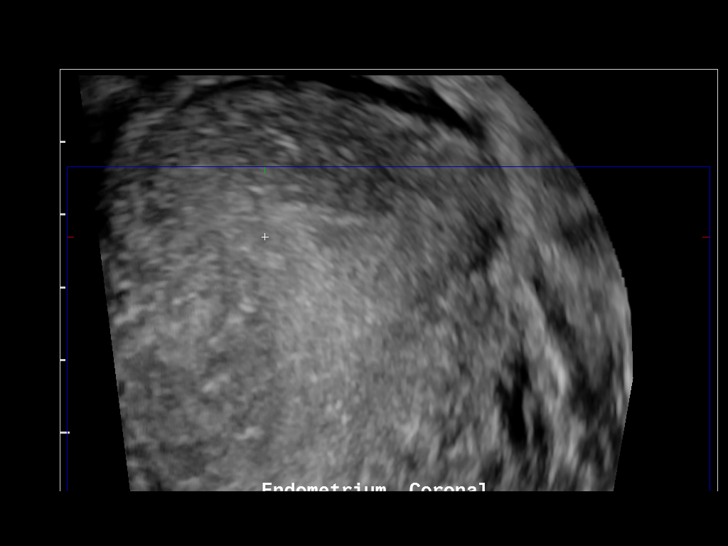
[im 48/72]
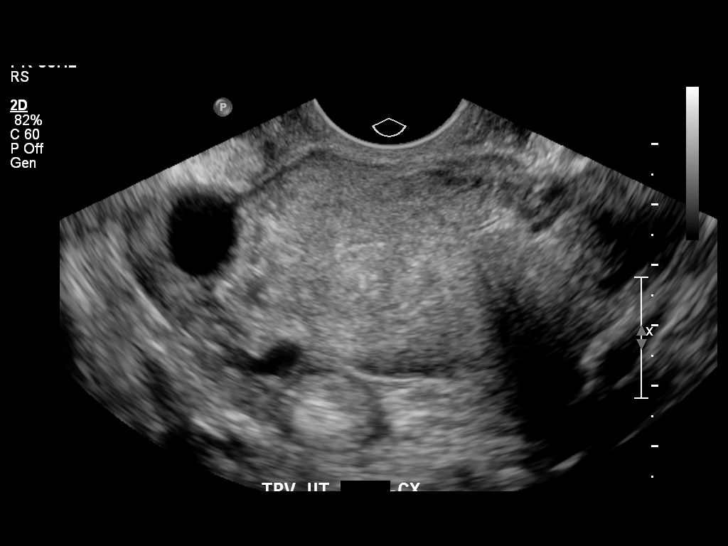
[im 54/72]
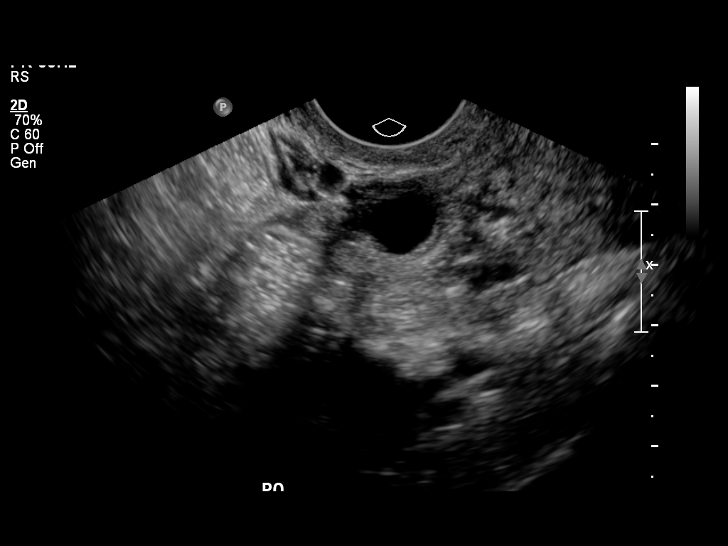
[im 60/72]
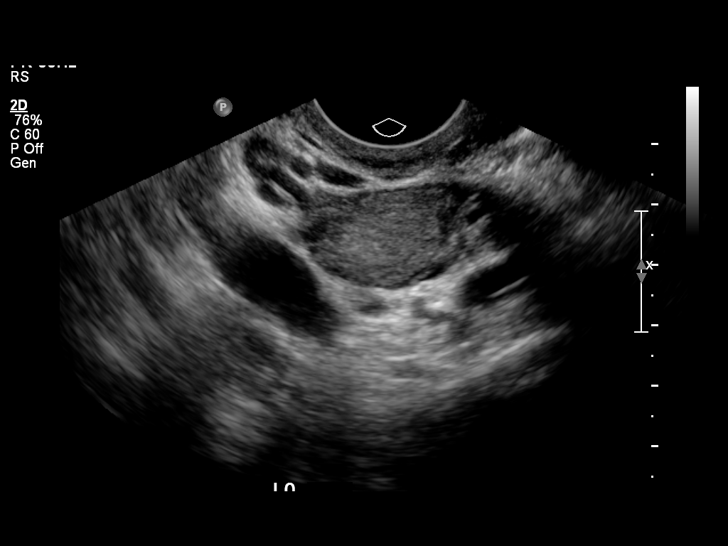
[im 66/72]
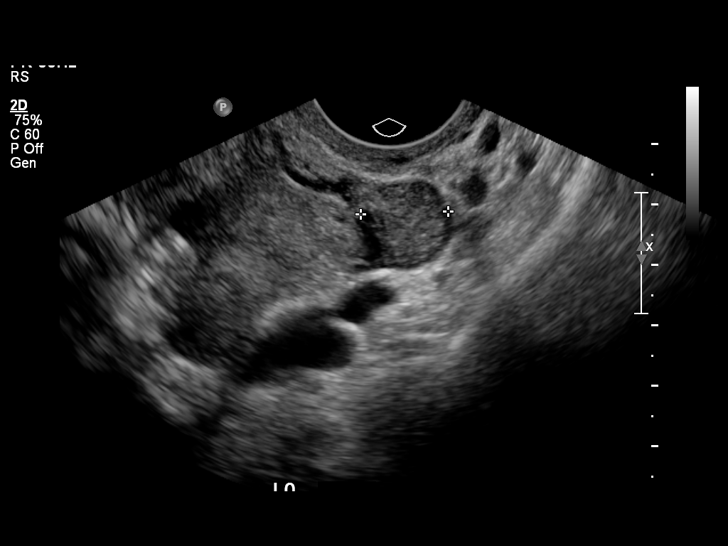
[im 72/72]
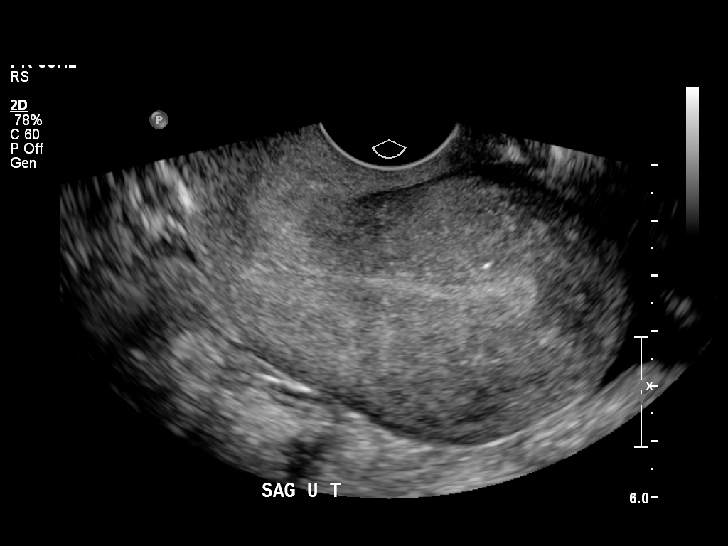

[13 of 25 positions shown; findings below may reference images not displayed]

FINDINGS: Uterus:  8.9 x 5.1 x 7.0 cm.  Retroverted.  An intramural fibroid
is seen in the left fundal region which measures 2.7 cm in maximum
diameter.  Two smaller subserosal fibroids are seen in the left
posterior corpus which measure 2.1 cm and 1.3 cm in maximum
diameter.

Endometrium: Endometrial thickness measures 7 mm transvaginally.
Normal trilayered appearance.

Right ovary: 4.0 x 2.5 x 2.6 cm.  Normal appearance.

Left ovary: 3.2 x 1.7 x 1.5 cm.  Normal appearance.

Other Findings:  Trace free fluid in pelvic cul-de-sac, which is
most likely physiologic in a reproductive age female.
IMPRESSION: 1.  Retroverted uterus, with several small left uterine fibroids
measuring up to 2.7 cm.
2.  Normal ovaries.  No adnexal mass identified.

## 2013-03-02 ENCOUNTER — Emergency Department (HOSPITAL_COMMUNITY)
Admission: EM | Admit: 2013-03-02 | Discharge: 2013-03-02 | Disposition: A | Discharge status: Home / Self Care | Payer: BC Managed Care – PPO | Attending: Emergency Medicine | Admitting: Emergency Medicine

## 2013-03-02 ENCOUNTER — Encounter (HOSPITAL_COMMUNITY): Payer: Self-pay | Admitting: Emergency Medicine

## 2013-03-02 DIAGNOSIS — F17200 Nicotine dependence, unspecified, uncomplicated: Secondary | ICD-10-CM | POA: Insufficient documentation

## 2013-03-02 DIAGNOSIS — G43909 Migraine, unspecified, not intractable, without status migrainosus: Secondary | ICD-10-CM

## 2013-03-02 DIAGNOSIS — H60399 Other infective otitis externa, unspecified ear: Secondary | ICD-10-CM | POA: Insufficient documentation

## 2013-03-02 DIAGNOSIS — H6090 Unspecified otitis externa, unspecified ear: Secondary | ICD-10-CM

## 2013-03-02 HISTORY — DX: Migraine, unspecified, not intractable, without status migrainosus: G43.909

## 2013-03-02 MED ORDER — ANTIPYRINE-BENZOCAINE 5.4-1.4 % OT SOLN
3.0000 [drp] | Freq: Once | OTIC | Status: AC
  Administered 2013-03-02: 4 [drp] via OTIC
  Filled 2013-03-02: qty 10

## 2013-03-02 MED ORDER — NEOMYCIN-POLYMYXIN-HC 3.5-10000-1 OT SUSP: 4.0000 [drp] | Freq: Three times a day (TID) | OTIC | Status: DC

## 2013-03-02 MED ORDER — METOCLOPRAMIDE HCL 5 MG/ML IJ SOLN
10.0000 mg | Freq: Once | INTRAMUSCULAR | Status: AC
  Administered 2013-03-02: 10 mg via INTRAVENOUS
  Filled 2013-03-02: qty 2

## 2013-03-02 MED ORDER — HYDROCODONE-ACETAMINOPHEN 5-325 MG PO TABS: 1.0000 | ORAL_TABLET | ORAL | Status: DC | PRN

## 2013-03-02 MED ORDER — HYDROMORPHONE HCL PF 1 MG/ML IJ SOLN
1.0000 mg | INTRAMUSCULAR | Status: DC | PRN
Start: 2013-03-02 — End: 2013-03-02
  Administered 2013-03-02: 1 mg via INTRAVENOUS
  Filled 2013-03-02: qty 1

## 2013-03-02 NOTE — Discharge Instructions (Signed)
Make sure you take all of your medications, follow up with your doctor if not improving, you can also take over-the-counter naproxen up to 500 mg twice a day.  Otitis Externa Otitis externa is a bacterial or fungal infection of the outer ear canal. This is the area from the eardrum to the outside of the ear. Otitis externa is sometimes called "swimmer's ear." CAUSES  Possible causes of infection include:  Swimming in dirty water.  Moisture remaining in the ear after swimming or bathing.  Mild injury (trauma) to the ear.  Objects stuck in the ear (foreign body).  Cuts or scrapes (abrasions) on the outside of the ear. SYMPTOMS  The first symptom of infection is often itching in the ear canal. Later signs and symptoms may include swelling and redness of the ear canal, ear pain, and yellowish-white fluid (pus) coming from the ear. The ear pain may be worse when pulling on the earlobe. DIAGNOSIS  Your caregiver will perform a physical exam. A sample of fluid may be taken from the ear and examined for bacteria or fungi. TREATMENT  Antibiotic ear drops are often given for 10 to 14 days. Treatment may also include pain medicine or corticosteroids to reduce itching and swelling. PREVENTION   Keep your ear dry. Use the corner of a towel to absorb water out of the ear canal after swimming or bathing.  Avoid scratching or putting objects inside your ear. This can damage the ear canal or remove the protective wax that lines the canal. This makes it easier for bacteria and fungi to grow.  Avoid swimming in lakes, polluted water, or poorly chlorinated pools.  You may use ear drops made of rubbing alcohol and vinegar after swimming. Combine equal parts of white vinegar and alcohol in a bottle. Put 3 or 4 drops into each ear after swimming. HOME CARE INSTRUCTIONS   Apply antibiotic ear drops to the ear canal as prescribed by your caregiver.  Only take over-the-counter or prescription medicines for  pain, discomfort, or fever as directed by your caregiver.  If you have diabetes, follow any additional treatment instructions from your caregiver.  Keep all follow-up appointments as directed by your caregiver. SEEK MEDICAL CARE IF:   You have a fever.  Your ear is still red, swollen, painful, or draining pus after 3 days.  Your redness, swelling, or pain gets worse.  You have a severe headache.  You have redness, swelling, pain, or tenderness in the area behind your ear. MAKE SURE YOU:   Understand these instructions.  Will watch your condition.  Will get help right away if you are not doing well or get worse. Document Released: 01/10/2005 Document Revised: 04/04/2011 Document Reviewed: 01/27/2011 [facility] Patient Information 2014 [place].  Migraine Headache A migraine headache is an intense, throbbing pain on one or both sides of your head. A migraine can last for 30 minutes to several hours. CAUSES  The exact cause of a migraine headache is not always known. However, a migraine may be caused when nerves in the brain become irritated and release chemicals that cause inflammation. This causes pain. Certain things may also trigger migraines, such as:  Alcohol.  Smoking.  Stress.  Menstruation.  Aged cheeses.  Foods or drinks that contain nitrates, glutamate, aspartame, or tyramine.  Lack of sleep.  Chocolate.  Caffeine.  Hunger.  Physical exertion.  Fatigue.  Medicines used to treat chest pain (nitroglycerine), birth control pills, estrogen, and some blood pressure medicines. SIGNS AND SYMPTOMS  Pain on one or both sides of your head.  Pulsating or throbbing pain.  Severe pain that prevents daily activities.  Pain that is aggravated by any physical activity.  Nausea, vomiting, or both.  Dizziness.  Pain with exposure to bright lights, loud noises, or activity.  General sensitivity to bright lights, loud noises, or smells. Before you  get a migraine, you may get warning signs that a migraine is coming (aura). An aura may include:  Seeing flashing lights.  Seeing bright spots, halos, or zig-zag lines.  Having tunnel vision or blurred vision.  Having feelings of numbness or tingling.  Having trouble talking.  Having muscle weakness. DIAGNOSIS  A migraine headache is often diagnosed based on:  Symptoms.  Physical exam.  A CT scan or MRI of your head. These imaging tests cannot diagnose migraines, but they can help rule out other causes of headaches. TREATMENT Medicines may be given for pain and nausea. Medicines can also be given to help prevent recurrent migraines.  HOME CARE INSTRUCTIONS  Only take over-the-counter or prescription medicines for pain or discomfort as directed by your health care provider. The use of long-term narcotics is not recommended.  Lie down in a dark, quiet room when you have a migraine.  Keep a journal to find out what may trigger your migraine headaches. For example, write down:  What you eat and drink.  How much sleep you get.  Any change to your diet or medicines.  Limit alcohol consumption.  Quit smoking if you smoke.  Get 7 9 hours of sleep, or as recommended by your health care provider.  Limit stress.  Keep lights dim if bright lights bother you and make your migraines worse. SEEK IMMEDIATE MEDICAL CARE IF:   Your migraine becomes severe.  You have a fever.  You have a stiff neck.  You have vision loss.  You have muscular weakness or loss of muscle control.  You start losing your balance or have trouble walking.  You feel faint or pass out.  You have severe symptoms that are different from your first symptoms. MAKE SURE YOU:   Understand these instructions.  Will watch your condition.  Will get help right away if you are not doing well or get worse. Document Released: 01/10/2005 Document Revised: 10/31/2012 Document Reviewed: 09/17/2012 [facility]  Patient Information 2014 [place].

## 2013-03-02 NOTE — ED Provider Notes (Signed)
CSN: [id number]     Arrival date & time 03/02/13  1227 History   First MD Initiated Contact with Patient 03/02/13 1240     Chief Complaint  Patient presents with  . Headache  . Otalgia    HPI Patient presents to the emergency room with complaints of a migraine type headache that started yesterday. Patient states the headache is dull and similar to her prior migraines. Patient has tried over-the-counter medications without relief. Sounds, light activity make her headache worse. She denies any neck pain or fever. She denies any sudden thunderclap onset. The patient is also had pain in her left ear. Left ear pain increases with palpation. She feels like she has had some drainage from that ear as well. She denies sore throat Past Medical History  Diagnosis Date  . Migraines    Past Surgical History  Procedure Laterality Date  . Liver surgery    . Tubal ligation     No family history on file. History  Substance Use Topics  . Smoking status: Current Every Day Smoker -- 0.50 packs/day  . Smokeless tobacco: Never Used  . Alcohol Use: Yes     Comment: Occas., social, special occas.   OB History   Grav Para Term Preterm Abortions TAB SAB Ect Mult Living   4 4 4  0 0 0 0 0 0 4     Review of Systems  All other systems reviewed and are negative.    Allergies  Review of patient's allergies indicates no known allergies.  Home Medications   Current Outpatient Rx  Name  Route  Sig  Dispense  Refill  . naproxen sodium (ALEVE) 220 MG tablet   Oral   Take 220 mg by mouth 2 (two) times daily as needed (headache/pain).         Marland Kitchen HYDROcodone-acetaminophen (NORCO/VICODIN) 5-325 MG per tablet   Oral   Take 1-2 tablets by mouth every 4 (four) hours as needed.   20 tablet   0   . neomycin-polymyxin-hydrocortisone (CORTISPORIN) 3.5-10000-1 otic suspension   Left Ear   Place 4 drops into the left ear 3 (three) times daily.   10 mL   0    BP 104/71  Pulse 82  Temp(Src) 97.8 F (36.6  C) (Oral)  Resp 16  SpO2 96%  LMP 11/30/2012 Physical Exam  Nursing note and vitals reviewed. Constitutional: She appears well-developed and well-nourished. No distress.  HENT:  Head: Normocephalic and atraumatic.  Right Ear: External ear and ear canal normal. Tympanic membrane is scarred.  Left Ear: There is drainage and tenderness. Tympanic membrane is scarred. Tympanic membrane is not perforated.  No middle ear effusion. No hemotympanum.  Eyes: Conjunctivae are normal. Right eye exhibits no discharge. Left eye exhibits no discharge. No scleral icterus.  Neck: Neck supple. No tracheal deviation present.  Cardiovascular: Normal rate, regular rhythm and intact distal pulses.   Pulmonary/Chest: Effort normal and breath sounds normal. No stridor. No respiratory distress. She has no wheezes. She has no rales.  Abdominal: Soft. Bowel sounds are normal. She exhibits no distension. There is no tenderness. There is no rebound and no guarding.  Musculoskeletal: She exhibits no edema and no tenderness.  Neurological: She is alert. She has normal strength. No cranial nerve deficit (no facial droop, extraocular movements intact, no slurred speech) or sensory deficit. She exhibits normal muscle tone. She displays no seizure activity. Coordination normal.  Skin: Skin is warm and dry. No rash noted.  Psychiatric: She has  a normal mood and affect.    ED Course  Procedures (including critical care time) Medications  antipyrine-benzocaine (AURALGAN) otic solution 3-4 drop (4 drops Left Ear Given 03/02/13 1311)  metoCLOPramide (REGLAN) injection 10 mg (10 mg Intravenous Given 03/02/13 1310)    MDM   1. Otitis externa   2. Migraine headache    Will treat for otitis externa.  Component of migraine headache.  Nl neuro exam, gradual onset.  Doubt emergency etiology of headache such as SAH, meningitis.  Follow up with PCP    Kathalene Frames, MD 03/05/13 [phone]

## 2013-03-02 NOTE — ED Notes (Signed)
Pt w/ hx of migraines c/o headache since yesterday.  Also c/o lt sided ear pain.  Denies NVD.

## 2013-03-18 ENCOUNTER — Encounter (HOSPITAL_COMMUNITY): Payer: Self-pay | Admitting: Emergency Medicine

## 2013-03-18 DIAGNOSIS — R1110 Vomiting, unspecified: Secondary | ICD-10-CM | POA: Insufficient documentation

## 2013-03-18 DIAGNOSIS — Z8679 Personal history of other diseases of the circulatory system: Secondary | ICD-10-CM | POA: Insufficient documentation

## 2013-03-18 DIAGNOSIS — F17200 Nicotine dependence, unspecified, uncomplicated: Secondary | ICD-10-CM | POA: Insufficient documentation

## 2013-03-18 DIAGNOSIS — J069 Acute upper respiratory infection, unspecified: Secondary | ICD-10-CM | POA: Insufficient documentation

## 2013-03-18 DIAGNOSIS — R42 Dizziness and giddiness: Secondary | ICD-10-CM | POA: Insufficient documentation

## 2013-03-18 DIAGNOSIS — Z3202 Encounter for pregnancy test, result negative: Secondary | ICD-10-CM | POA: Insufficient documentation

## 2013-03-18 DIAGNOSIS — R52 Pain, unspecified: Secondary | ICD-10-CM | POA: Insufficient documentation

## 2013-03-18 LAB — CBC WITH DIFFERENTIAL/PLATELET
BASOS PCT: 1 % (ref 0–1)
Basophils Absolute: 0 10*3/uL (ref 0.0–0.1)
EOS PCT: 5 % (ref 0–5)
Eosinophils Absolute: 0.2 10*3/uL (ref 0.0–0.7)
HCT: 35.4 % — ABNORMAL LOW (ref 36.0–46.0)
Hemoglobin: 12.2 g/dL (ref 12.0–15.0)
Lymphocytes Relative: 26 % (ref 12–46)
Lymphs Abs: 1.1 10*3/uL (ref 0.7–4.0)
MCH: 33.2 pg (ref 26.0–34.0)
MCHC: 34.5 g/dL (ref 30.0–36.0)
MCV: 96.5 fL (ref 78.0–100.0)
Monocytes Absolute: 0.2 10*3/uL (ref 0.1–1.0)
Monocytes Relative: 5 % (ref 3–12)
NEUTROS PCT: 64 % (ref 43–77)
Neutro Abs: 2.7 10*3/uL (ref 1.7–7.7)
PLATELETS: 218 10*3/uL (ref 150–400)
RBC: 3.67 MIL/uL — ABNORMAL LOW (ref 3.87–5.11)
RDW: 13.5 % (ref 11.5–15.5)
WBC: 4.3 10*3/uL (ref 4.0–10.5)

## 2013-03-18 LAB — BASIC METABOLIC PANEL
BUN: 7 mg/dL (ref 6–23)
CALCIUM: 8.6 mg/dL (ref 8.4–10.5)
CO2: 25 mEq/L (ref 19–32)
Chloride: 101 mEq/L (ref 96–112)
Creatinine, Ser: 0.9 mg/dL (ref 0.50–1.10)
GFR calc Af Amer: >90 mL/min (ref >90)
GFR, EST NON AFRICAN AMERICAN: 78 mL/min — AB (ref >90)
GLUCOSE: 96 mg/dL (ref 70–99)
Potassium: 3.7 mEq/L (ref 3.7–5.3)
Sodium: 138 mEq/L (ref 137–147)

## 2013-03-18 NOTE — ED Notes (Signed)
Pt presents with c/o sore throat, blurred vision, dizziness, headache, fever, chills, and vomiting. Pt says her chest hurts but only when she coughs. Pt says her symptoms started 2 days ago. Pt says she has vomited 5 times in the last 2 days, unable to keep anything down.

## 2013-03-19 ENCOUNTER — Emergency Department (HOSPITAL_COMMUNITY)
Admission: EM | Admit: 2013-03-19 | Discharge: 2013-03-19 | Disposition: A | Discharge status: Home / Self Care | Payer: Self-pay | Attending: Emergency Medicine | Admitting: Emergency Medicine

## 2013-03-19 DIAGNOSIS — B9789 Other viral agents as the cause of diseases classified elsewhere: Secondary | ICD-10-CM

## 2013-03-19 DIAGNOSIS — J988 Other specified respiratory disorders: Secondary | ICD-10-CM

## 2013-03-19 LAB — URINALYSIS, ROUTINE W REFLEX MICROSCOPIC
BILIRUBIN URINE: NEGATIVE
Glucose, UA: NEGATIVE mg/dL
Hgb urine dipstick: NEGATIVE
Ketones, ur: NEGATIVE mg/dL
LEUKOCYTES UA: NEGATIVE
Nitrite: NEGATIVE
PROTEIN: 30 mg/dL — AB
Specific Gravity, Urine: 1.029 (ref 1.005–1.030)
Urobilinogen, UA: 1 mg/dL (ref 0.0–1.0)
pH: 8 (ref 5.0–8.0)

## 2013-03-19 LAB — URINE MICROSCOPIC-ADD ON

## 2013-03-19 LAB — POC URINE PREG, ED: PREG TEST UR: NEGATIVE

## 2013-03-19 MED ORDER — ONDANSETRON HCL 4 MG/2ML IJ SOLN
4.0000 mg | Freq: Once | INTRAMUSCULAR | Status: AC
  Administered 2013-03-19: 4 mg via INTRAVENOUS
  Filled 2013-03-19: qty 2

## 2013-03-19 MED ORDER — ALBUTEROL SULFATE HFA 108 (90 BASE) MCG/ACT IN AERS
2.0000 | INHALATION_SPRAY | RESPIRATORY_TRACT | Status: DC | PRN
  Administered 2013-03-19: 2 via RESPIRATORY_TRACT
  Filled 2013-03-19: qty 6.7

## 2013-03-19 MED ORDER — OSELTAMIVIR PHOSPHATE 75 MG PO CAPS
75.0000 mg | ORAL_CAPSULE | Freq: Every day | ORAL | Status: DC
  Administered 2013-03-19: 75 mg via ORAL
  Filled 2013-03-19: qty 1

## 2013-03-19 MED ORDER — HYDROCODONE-ACETAMINOPHEN 7.5-325 MG/15ML PO SOLN
10.0000 mL | Freq: Once | ORAL | Status: AC
  Administered 2013-03-19: 10 mL via ORAL
  Filled 2013-03-19: qty 15

## 2013-03-19 MED ORDER — ONDANSETRON 8 MG PO TBDP
8.0000 mg | ORAL_TABLET | Freq: Once | ORAL | Status: AC
  Administered 2013-03-19: 8 mg via ORAL
  Filled 2013-03-19: qty 1

## 2013-03-19 MED ORDER — OSELTAMIVIR PHOSPHATE 75 MG PO CAPS: 75.0000 mg | ORAL_CAPSULE | Freq: Two times a day (BID) | ORAL | Status: DC

## 2013-03-19 MED ORDER — SODIUM CHLORIDE 0.9 % IV BOLUS (SEPSIS)
1000.0000 mL | Freq: Once | INTRAVENOUS | Status: AC
  Administered 2013-03-19: 1000 mL via INTRAVENOUS

## 2013-03-19 MED ORDER — HYDROCODONE-ACETAMINOPHEN 7.5-325 MG/15ML PO SOLN: 10.0000 mL | Freq: Four times a day (QID) | ORAL | Status: DC | PRN

## 2013-03-19 MED ORDER — PROMETHAZINE HCL 25 MG PO TABS: 25.0000 mg | ORAL_TABLET | Freq: Four times a day (QID) | ORAL | Status: DC | PRN

## 2013-03-19 MED ORDER — KETOROLAC TROMETHAMINE 15 MG/ML IJ SOLN
30.0000 mg | Freq: Once | INTRAMUSCULAR | Status: AC
  Administered 2013-03-19: 30 mg via INTRAVENOUS
  Filled 2013-03-19: qty 2

## 2013-03-19 NOTE — ED Notes (Signed)
Bed: WA21 Expected date:  Expected time:  Means of arrival:  Comments: 

## 2013-03-19 NOTE — Discharge Instructions (Signed)

## 2013-03-19 NOTE — ED Provider Notes (Signed)
CSN: [id number]     Arrival date & time 03/18/13  1847 History   First MD Initiated Contact with Patient 03/19/13 0411     Chief Complaint  Patient presents with  . Sore Throat  . Headache  . Generalized Body Aches     (Consider location/radiation/quality/duration/timing/severity/associated sxs/prior Treatment) HPI History provided by patient. Works as a Health visitor with sick contacts exposures, she did not get a flu shot this season. Today he developed headache, sore throat, dizziness, fevers, chills, cough, wheezing, vomiting and generalized body aches. Symptoms moderate to severe. No diarrhea. No abdominal pain. No rashes. No recent travel.   Past Medical History  Diagnosis Date  . Migraines    Past Surgical History  Procedure Laterality Date  . Liver surgery    . Tubal ligation     No family history on file. History  Substance Use Topics  . Smoking status: Current Every Day Smoker -- 0.50 packs/day  . Smokeless tobacco: Never Used  . Alcohol Use: Yes     Comment: Occas., social, special occas.   OB History   Grav Para Term Preterm Abortions TAB SAB Ect Mult Living   4 4 4  0 0 0 0 0 0 4     Review of Systems  Constitutional: Positive for fever and chills.  HENT: Positive for congestion and sore throat.   Respiratory: Positive for cough and wheezing.   Cardiovascular: Negative for chest pain.  Gastrointestinal: Negative for abdominal pain.  Genitourinary: Negative for dysuria.  Musculoskeletal: Negative for neck stiffness.  Skin: Negative for rash.  Neurological: Negative for syncope.  All other systems reviewed and are negative.      Allergies  Review of patient's allergies indicates no known allergies.  Home Medications   Current Outpatient Rx  Name  Route  Sig  Dispense  Refill  . aspirin-sod bicarb-citric acid (ALKA-SELTZER) 325 MG TBEF tablet   Oral   Take 325 mg by mouth every 6 (six) hours as needed (flu-like symptom).         .  DM-Doxylamine-Acetaminophen (NYQUIL COLD & FLU PO)   Oral   Take 1 capsule by mouth at bedtime as needed (flu-like symptoms).         . naproxen sodium (ALEVE) 220 MG tablet   Oral   Take 220 mg by mouth 2 (two) times daily as needed (headache/pain).          BP 126/84  Pulse 99  Temp(Src) 100.2 F (37.9 C) (Oral)  Resp 20  Ht 5\' 4"  (1.626 m)  Wt 168 lb (76.204 kg)  BMI 28.82 kg/m2  SpO2 99%  LMP 01/23/2013 Physical Exam  Constitutional: She is oriented to person, place, and time. She appears well-developed and well-nourished.  HENT:  Head: Normocephalic and atraumatic.  Mouth/Throat: Oropharynx is clear and moist. No oropharyngeal exudate.  Nasal congestion  Eyes: EOM are normal. Pupils are equal, round, and reactive to light.  Neck: Neck supple.  Cardiovascular: Normal rate, regular rhythm and intact distal pulses.   Pulmonary/Chest: Effort normal. No stridor. No respiratory distress.  Some mild intermittent expiratory wheezes otherwise good air movement  Abdominal: Soft. Bowel sounds are normal. She exhibits no distension. There is no tenderness. There is no rebound.  Musculoskeletal: Normal range of motion. She exhibits no edema and no tenderness.  Lymphadenopathy:    She has no cervical adenopathy.  Neurological: She is alert and oriented to person, place, and time.  Skin: Skin is warm and dry.  ED Course  Procedures (including critical care time) Labs Review Labs Reviewed  BASIC METABOLIC PANEL - Abnormal; Notable for the following:    GFR calc non Af Amer 78 (*)    All other components within normal limits  CBC WITH DIFFERENTIAL - Abnormal; Notable for the following:    RBC 3.67 (*)    HCT 35.4 (*)    All other components within normal limits  URINALYSIS, ROUTINE W REFLEX MICROSCOPIC - Abnormal; Notable for the following:    Protein, ur 30 (*)    All other components within normal limits  URINE MICROSCOPIC-ADD ON  POC URINE PREG, ED   Room air pulse  ox 97% is adequate  IV fluids. IV Zofran. IV Toradol. Albuterol breathing treatment provided. Tamiflu initiated  5:22 AM on recheck breathing feels improved Will send patient home with inhaler. She is tolerating by mouth fluids  Plan discharge home with prescription for Tamiflu, Lortab elixir, Zofran and outpatient followup. Return precautions provided. Anticipatory guidance.   MDM   Diagnosis: Viral infection  Concern for influenza Symptomatically improved with medications provided Labs obtained and reviewed as above Vital signs and nursing notes reviewed and considered    Teressa Lower, MD 03/19/13 [phone]

## 2013-03-20 NOTE — Progress Notes (Addendum)
   CARE MANAGEMENT ED NOTE 03/20/2013  Patient:  Julie Valentine, Julie Valentine   Account Number:  [id number]  Date Initiated:  03/20/2013  Documentation initiated by:  Jackelyn Poling  Subjective/Objective Assessment:   43 yr old self pay [place] resident left voice message with Julie Valentine [place] stating she could not afford medicine Rx from 03/19/13 tamilflu for dx influenza     Subjective/Objective Assessment Detail:   pcp listed as Julie Valentine    Pt is eligible for [facility] MATCH program (unable to find pt listed in PDMI per cardholder name inquiry)     Action/Plan:   ED Cm reviewed EPIC notes, AVS, d/c instructions, Cm dialed [phone] as provided to [place]  & received a Julie Valentine to state CM had an incorrect number CM dialed home number listed in [place]   Action/Plan Detail:   but this number does not have a voice mailbox Cm dialed pt's emergency contact,  Julie Valentine, Mother at [phone] & left a voice message requesting a return call to CM's office number   Anticipated DC Date:  03/20/2013     Status Recommendation to Physician:   Result of Recommendation:    Other ED [place]  Other  Medication Assistance  [place] [facility]  Outpatient Services - Pt will follow up    Choice offered to / List presented to:            Status of service:  Completed, signed off  ED Comments:   ED Comments Detail:    03/20/13 1652 CM left a voice message at [phone] informing pt Julie Valentine letter is at [place] confirmed Julie Valentine letter at the pharmacy  1633 Cm spoke with Julie Valentine at [facility] to check to see if Julie Valentine letter received Cm informed was not received yet  1621 CM received fax confirmation that Julie Valentine letter sent successfully to [facility] fax 1615 Pt called back to [facility] Cm reverified her number as [phone].  CM spoke with the pt about [facility] MATCH program ($3 co pay for each Rx through [facility] program, does not include refills, 7 day expiration of [place]  letter and choice of pharmacies) Pt agreed to receive assistance from program & states she will pick up medicine today. Cm encouraged pt to call [facility] prior to arrival to verify [facility] letter present & to take her Prescriptions with her Pt confirmed Julie Julie Valentine as her pcp for further f/u services   03/20/13 1125 PDMI information entered. [place] letter completed Pending a call from pt

## 2013-09-06 ENCOUNTER — Inpatient Hospital Stay (HOSPITAL_COMMUNITY)
Admission: AD | Admit: 2013-09-06 | Discharge: 2013-09-06 | Disposition: A | Discharge status: Home / Self Care | Payer: Self-pay | Source: Ambulatory Visit | Attending: Obstetrics | Admitting: Obstetrics

## 2013-09-06 ENCOUNTER — Encounter (HOSPITAL_COMMUNITY): Payer: Self-pay | Admitting: *Deleted

## 2013-09-06 DIAGNOSIS — F17200 Nicotine dependence, unspecified, uncomplicated: Secondary | ICD-10-CM | POA: Insufficient documentation

## 2013-09-06 DIAGNOSIS — R109 Unspecified abdominal pain: Secondary | ICD-10-CM | POA: Insufficient documentation

## 2013-09-06 DIAGNOSIS — Z3202 Encounter for pregnancy test, result negative: Secondary | ICD-10-CM | POA: Insufficient documentation

## 2013-09-06 DIAGNOSIS — N912 Amenorrhea, unspecified: Secondary | ICD-10-CM | POA: Insufficient documentation

## 2013-09-06 DIAGNOSIS — Z9851 Tubal ligation status: Secondary | ICD-10-CM | POA: Insufficient documentation

## 2013-09-06 LAB — URINALYSIS, ROUTINE W REFLEX MICROSCOPIC
Bilirubin Urine: NEGATIVE
GLUCOSE, UA: NEGATIVE mg/dL
KETONES UR: NEGATIVE mg/dL
Leukocytes, UA: NEGATIVE
NITRITE: NEGATIVE
PH: 6 (ref 5.0–8.0)
Protein, ur: NEGATIVE mg/dL
SPECIFIC GRAVITY, URINE: 1.025 (ref 1.005–1.030)
Urobilinogen, UA: 0.2 mg/dL (ref 0.0–1.0)

## 2013-09-06 LAB — URINE MICROSCOPIC-ADD ON

## 2013-09-06 LAB — HCG, QUANTITATIVE, PREGNANCY: HCG, BETA CHAIN, QUANT, S: 2 m[IU]/mL (ref <5)

## 2013-09-06 LAB — POCT PREGNANCY, URINE: PREG TEST UR: NEGATIVE

## 2013-09-06 MED ORDER — MEDROXYPROGESTERONE ACETATE 10 MG PO TABS: 10.0000 mg | ORAL_TABLET | Freq: Every day | ORAL | Status: DC

## 2013-09-06 NOTE — Discharge Instructions (Signed)
Secondary Amenorrhea  Secondary amenorrhea is the stopping of menstrual flow for 3-6 months in a female who has previously had periods. There are many possible causes. Most of these causes are not serious. Usually, treating the underlying problem causing the loss of menses will return your periods to normal. CAUSES  Some common and uncommon causes of not menstruating include:  Malnutrition.  Low blood sugar (hypoglycemia).  Polycystic ovary disease.  Stress or fear.  Breastfeeding.  Hormone imbalance.  Ovarian failure.  Medicines.  Extreme obesity.  Cystic fibrosis.  Low body weight or drastic weight reduction from any cause.  Early menopause.  Removal of ovaries or uterus.  Contraceptives.  Illness.  Long-term (chronic) illnesses.  Cushing syndrome.  Thyroid problems.  Birth control pills, patches, or vaginal rings for birth control. RISK FACTORS You may be at greater risk of secondary amenorrhea if:  You have a family history of this condition.  You have an eating disorder.  You do athletic training. DIAGNOSIS  A diagnosis is made by your health care provider taking a medical history and doing a physical exam. This will include a pelvic exam to check for problems with your reproductive organs. Pregnancy must be ruled out. Often, numerous blood tests are done to measure different hormones in the body. Urine testing may be done. Specialized exams (ultrasound, CT scan, MRI, or hysteroscopy) may have to be done as well as measuring the body mass index (BMI). TREATMENT  Treatment depends on the cause of the amenorrhea. If an eating disorder is present, this can be treated with an adequate diet and therapy. Chronic illnesses may improve with treatment of the illness. Amenorrhea may be corrected with medicines, lifestyle changes, or surgery. If the amenorrhea cannot be corrected, it is sometimes possible to create a false menstruation with medicines. HOME CARE  INSTRUCTIONS  Maintain a healthy diet.  Manage weight problems.  Exercise regularly but not excessively.  Get adequate sleep.  Manage stress.  Be aware of changes in your menstrual cycle. Keep a record of when your periods occur. Note the date your period starts, how long it lasts, and any problems. SEEK MEDICAL CARE IF: Your symptoms do not get better with treatment. Document Released: 02/21/2006 Document Revised: 09/12/2012 Document Reviewed: 06/28/2012 [employer] Patient Information 2015 [employer]. This information is not intended to replace advice given to you by your health care provider. Make sure you discuss any questions you have with your health care provider.  

## 2013-09-06 NOTE — MAU Provider Note (Signed)
History     CSN: [id number]  Arrival date and time: 09/06/13 1554   First Provider Initiated Contact with Patient 09/06/13 1804      Chief Complaint  Patient presents with   Possible Pregnancy   HPI Julie Valentine 41 [id number] had a tubal ligation 20 years ago.  She used condoms routinely for the first 15 years but now does not use condoms now.  Yesterday she took 2 home pregnancy tests that were positive and then one today that also was positive.    She has not had a period since June 15.  She has had some cramping that started yesterday that she rates as 7/10.  She does not use medication for this unless it reaches 10/10.  She denies discharge, urinary symptoms, vomiting.  She is having lower abdominal pain associated with back pain and nausea.   OB History   Grav Para Term Preterm Abortions TAB SAB Ect Mult Living   4 4 4  0 0 0 0 0 0 4      Past Medical History  Diagnosis Date   Migraines     Past Surgical History  Procedure Laterality Date   Liver surgery     Tubal ligation      Family History  Problem Relation Age of Onset   Hypertension Father     History  Substance Use Topics   Smoking status: Current Every Day Smoker -- 0.50 packs/day   Smokeless tobacco: Never Used   Alcohol Use: Yes     Comment: Occas., social, special occas.    Allergies: No Known Allergies  No prescriptions prior to admission    Review of Systems  Constitutional: Negative for fever and chills.  HENT: Negative for congestion and sore throat.   Eyes: Negative for blurred vision.  Respiratory: Negative for cough and shortness of breath.   Cardiovascular: Negative for chest pain and palpitations.  Gastrointestinal: Positive for heartburn, nausea and abdominal pain. Negative for vomiting, diarrhea and constipation.  Genitourinary: Negative for dysuria, urgency, frequency and hematuria.  Skin: Negative for rash.  Neurological: Positive for dizziness. Negative for weakness.    Physical Exam   Blood pressure 111/77, pulse 79, temperature 98.6 F (37 C), temperature source Oral, resp. rate 18, height 5' 4.5" (1.638 m), weight 74.844 kg (165 lb), last menstrual period 07/08/2013.  Physical Exam  Constitutional: She is oriented to person, place, and time. She appears well-developed and well-nourished. No distress.  HENT:  Head: Normocephalic and atraumatic.  Eyes: EOM are normal.  Cardiovascular: Normal rate, regular rhythm and normal heart sounds.  Exam reveals no gallop and no friction rub.   No murmur heard. Respiratory: Effort normal and breath sounds normal. No respiratory distress.  GI: Soft. Bowel sounds are normal. She exhibits no distension. There is tenderness.  Musculoskeletal: Normal range of motion.  Neurological: She is alert and oriented to person, place, and time.  Skin: Skin is warm and dry.  Psychiatric: She has a normal mood and affect.  She declines pelvic exam.    Results for orders placed during the hospital encounter of 09/06/13 (from the past 24 hour(s))  URINALYSIS, ROUTINE W REFLEX MICROSCOPIC     Status: Abnormal   Collection Time    09/06/13  4:35 PM      Result Value Ref Range   Color, Urine YELLOW  YELLOW   APPearance CLEAR  CLEAR   Specific Gravity, Urine 1.025  1.005 - 1.030   pH 6.0  5.0 -  8.0   Glucose, UA NEGATIVE  NEGATIVE mg/dL   Hgb urine dipstick TRACE (*) NEGATIVE   Bilirubin Urine NEGATIVE  NEGATIVE   Ketones, ur NEGATIVE  NEGATIVE mg/dL   Protein, ur NEGATIVE  NEGATIVE mg/dL   Urobilinogen, UA 0.2  0.0 - 1.0 mg/dL   Nitrite NEGATIVE  NEGATIVE   Leukocytes, UA NEGATIVE  NEGATIVE  URINE MICROSCOPIC-ADD ON     Status: Abnormal   Collection Time    09/06/13  4:35 PM      Result Value Ref Range   Squamous Epithelial / LPF FEW (*) RARE   RBC / HPF 0-2  <3 RBC/hpf   Urine-Other MUCOUS PRESENT    POCT PREGNANCY, URINE     Status: None   Collection Time    09/06/13  4:41 PM      Result Value Ref Range   Preg  Test, Ur NEGATIVE  NEGATIVE  HCG, QUANTITATIVE, PREGNANCY     Status: None   Collection Time    09/06/13  5:00 PM      Result Value Ref Range   hCG, Beta Chain, Quant, S 2  <5 mIU/mL    MAU Course  Procedures none  MDM Urine PRT is negative Quant HCG is negative    Assessment and Plan  Assessment: irregular menstrual cycle  Plan: Discharge to home Provera 10mg  po x 5 days.   Follow up with GYN Return to MAU for emergency  Paticia Stack 09/06/2013, 6:05 PM

## 2013-09-06 NOTE — MAU Note (Signed)
Has had tubal, no period in 2 months.  Has had 3 pos HPT. Is cramping, started last night.

## 2013-09-06 NOTE — MAU Note (Signed)
LNMP was June 15; had tubes tied about 20 yrs ago;

## 2013-11-25 ENCOUNTER — Encounter (HOSPITAL_COMMUNITY): Payer: Self-pay | Admitting: *Deleted

## 2014-06-03 ENCOUNTER — Emergency Department (HOSPITAL_COMMUNITY)
Admission: EM | Admit: 2014-06-03 | Discharge: 2014-06-03 | Disposition: A | Discharge status: Home / Self Care | Payer: Self-pay | Attending: Emergency Medicine | Admitting: Emergency Medicine

## 2014-06-03 ENCOUNTER — Encounter (HOSPITAL_COMMUNITY): Payer: Self-pay

## 2014-06-03 DIAGNOSIS — G43009 Migraine without aura, not intractable, without status migrainosus: Secondary | ICD-10-CM

## 2014-06-03 DIAGNOSIS — Z720 Tobacco use: Secondary | ICD-10-CM | POA: Insufficient documentation

## 2014-06-03 DIAGNOSIS — Z9851 Tubal ligation status: Secondary | ICD-10-CM | POA: Insufficient documentation

## 2014-06-03 DIAGNOSIS — Z3202 Encounter for pregnancy test, result negative: Secondary | ICD-10-CM | POA: Insufficient documentation

## 2014-06-03 DIAGNOSIS — G43909 Migraine, unspecified, not intractable, without status migrainosus: Secondary | ICD-10-CM | POA: Insufficient documentation

## 2014-06-03 DIAGNOSIS — Z793 Long term (current) use of hormonal contraceptives: Secondary | ICD-10-CM | POA: Insufficient documentation

## 2014-06-03 LAB — POC URINE PREG, ED: PREG TEST UR: NEGATIVE

## 2014-06-03 MED ORDER — DIPHENHYDRAMINE HCL 50 MG/ML IJ SOLN
50.0000 mg | Freq: Once | INTRAMUSCULAR | Status: AC
  Administered 2014-06-03: 50 mg via INTRAVENOUS
  Filled 2014-06-03: qty 1

## 2014-06-03 MED ORDER — KETOROLAC TROMETHAMINE 30 MG/ML IJ SOLN: 30.0000 mg | Freq: Once | INTRAMUSCULAR | Status: DC

## 2014-06-03 MED ORDER — METOCLOPRAMIDE HCL 5 MG/ML IJ SOLN
10.0000 mg | Freq: Once | INTRAMUSCULAR | Status: AC
  Administered 2014-06-03: 10 mg via INTRAVENOUS
  Filled 2014-06-03: qty 2

## 2014-06-03 MED ORDER — SODIUM CHLORIDE 0.9 % IV BOLUS (SEPSIS)
1000.0000 mL | Freq: Once | INTRAVENOUS | Status: AC
  Administered 2014-06-03: 1000 mL via INTRAVENOUS

## 2014-06-03 MED ORDER — KETOROLAC TROMETHAMINE 15 MG/ML IJ SOLN
15.0000 mg | Freq: Once | INTRAMUSCULAR | Status: AC
  Administered 2014-06-03: 15 mg via INTRAVENOUS
  Filled 2014-06-03: qty 1

## 2014-06-03 MED ORDER — METOCLOPRAMIDE HCL 10 MG PO TABS: 10.0000 mg | ORAL_TABLET | Freq: Four times a day (QID) | ORAL | Status: DC | PRN

## 2014-06-03 NOTE — Discharge Instructions (Signed)
If you were given medicines take as directed.  If you are on coumadin or contraceptives realize their levels and effectiveness is altered by many different medicines.  If you have any reaction (rash, tongues swelling, other) to the medicines stop taking and see a physician.   Please follow up as directed and return to the ER or see a physician for new or worsening symptoms.  Thank you. Filed Vitals:   06/03/14 2059 06/03/14 2303  BP: 98/68 129/81  Pulse: 77 64  Temp: 98.3 F (36.8 C)   TempSrc: Oral   Resp: 17 16  SpO2: 99% 99%   Migraine Headache A migraine headache is very bad, throbbing pain on one or both sides of your head. Talk to your doctor about what things may bring on (trigger) your migraine headaches. HOME CARE  Only take medicines as told by your doctor.  Lie down in a dark, quiet room when you have a migraine.  Keep a journal to find out if certain things bring on migraine headaches. For example, write down:  What you eat and drink.  How much sleep you get.  Any change to your diet or medicines.  Lessen how much alcohol you drink.  Quit smoking if you smoke.  Get enough sleep.  Lessen any stress in your life.  Keep lights dim if bright lights bother you or make your migraines worse. GET HELP RIGHT AWAY IF:   Your migraine becomes really bad.  You have a fever.  You have a stiff neck.  You have trouble seeing.  Your muscles are weak, or you lose muscle control.  You lose your balance or have trouble walking.  You feel like you will pass out (faint), or you pass out.  You have really bad symptoms that are different than your first symptoms. MAKE SURE YOU:   Understand these instructions.  Will watch your condition.  Will get help right away if you are not doing well or get worse. Document Released: 10/20/2007 Document Revised: 04/04/2011 Document Reviewed: 09/17/2012 [facility] Patient Information 2015 [place], [place]. This information is not  intended to replace advice given to you by your health care provider. Make sure you discuss any questions you have with your health care provider.

## 2014-06-03 NOTE — ED Notes (Signed)
Pt complains of a migraine since 1230 this afternoon, no medications taken, she also complains of nausea and vomiting

## 2014-06-03 NOTE — ED Provider Notes (Signed)
CSN: [id number]     Arrival date & time 06/03/14  2036 History   First MD Initiated Contact with Patient 06/03/14 2114     Chief Complaint  Patient presents with  . Migraine     (Consider location/radiation/quality/duration/timing/severity/associated sxs/prior Treatment) HPI Comments: 44 year old female with history of tubal ligation presents with migraine since early this afternoon, similar to previous, nausea and photophobia, gradual onset. No history of aneurysm no head injury. No fevers. No urinary symptoms. Patient not currently pregnant. Symptoms gradually worsening nothing is improved normally tries over-the-counter medications. Smoker cigarettes.  Patient is a 44 y.o. female presenting with migraines. The history is provided by the patient.  Migraine Associated symptoms include headaches. Pertinent negatives include no chest pain, no abdominal pain and no shortness of breath.    Past Medical History  Diagnosis Date  . Migraines    Past Surgical History  Procedure Laterality Date  . Liver surgery    . Tubal ligation     Family History  Problem Relation Age of Onset  . Hypertension Father    History  Substance Use Topics  . Smoking status: Current Every Day Smoker -- 0.50 packs/day  . Smokeless tobacco: Never Used  . Alcohol Use: Yes     Comment: Occas., social, special occas.   OB History    Gravida Para Term Preterm AB TAB SAB Ectopic Multiple Living   4 4 4  0 0 0 0 0 0 4     Review of Systems  Constitutional: Negative for fever and chills.  HENT: Negative for congestion.   Eyes: Positive for photophobia. Negative for visual disturbance.  Respiratory: Negative for shortness of breath.   Cardiovascular: Negative for chest pain.  Gastrointestinal: Positive for nausea and vomiting. Negative for abdominal pain.  Genitourinary: Negative for dysuria and flank pain.  Musculoskeletal: Negative for back pain, neck pain and neck stiffness.  Skin: Negative for rash.   Neurological: Positive for light-headedness and headaches.      Allergies  Review of patient's allergies indicates no known allergies.  Home Medications   Prior to Admission medications   Medication Sig Start Date End Date Taking? Authorizing Provider  acetaminophen (TYLENOL) 325 MG tablet Take 650 mg by mouth every 6 (six) hours as needed.   Yes Historical Provider, MD  aspirin-acetaminophen-caffeine (EXCEDRIN MIGRAINE) [phone] MG per tablet Take 1 tablet by mouth every 6 (six) hours as needed for headache.   Yes Historical Provider, MD  medroxyPROGESTERone (PROVERA) 10 MG tablet Take 1 tablet (10 mg total) by mouth daily. Patient not taking: Reported on 06/03/2014 09/06/13   Frederico Hamman, MD  metoCLOPramide (REGLAN) 10 MG tablet Take 1 tablet (10 mg total) by mouth every 6 (six) hours as needed for nausea (nausea/headache). 06/03/14   Elnora Morrison, MD   BP 129/81 mmHg  Pulse 64  Temp(Src) 98.3 F (36.8 C) (Oral)  Resp 16  SpO2 99%  LMP 06/03/2014 Physical Exam  Constitutional: She is oriented to person, place, and time. She appears well-developed and well-nourished.  HENT:  Head: Normocephalic and atraumatic.  Eyes: Conjunctivae are normal. Right eye exhibits no discharge. Left eye exhibits no discharge.  Neck: Normal range of motion. Neck supple. No tracheal deviation present.  Cardiovascular: Normal rate and regular rhythm.   Pulmonary/Chest: Effort normal and breath sounds normal.  Abdominal: Soft. She exhibits no distension. There is no tenderness. There is no guarding.  Musculoskeletal: She exhibits no edema.  Neurological: She is alert and oriented to person, place,  and time. GCS eye subscore is 4. GCS verbal subscore is 5. GCS motor subscore is 6.  No meningismus 5+ strength in UE and LE with f/e at major joints. Sensation to palpation intact in UE and LE. CNs 2-12 grossly intact.  EOMFI.  PERRL.      Skin: Skin is warm. No rash noted.  Psychiatric: She  has a normal mood and affect.  Nursing note and vitals reviewed.   ED Course  Procedures (including critical care time) Labs Review Labs Reviewed  POC URINE PREG, ED    Imaging Review No results found.   EKG Interpretation None      MDM   Final diagnoses:  Nonintractable migraine, unspecified migraine type   Patient presents with headache similar to previous gradual onset no red flags at this time. Plan for migraine cocktail and reassessment. Pt improved on recheck. Results and differential diagnosis were discussed with the patient/parent/guardian. Close follow up outpatient was discussed, comfortable with the plan.   Medications  sodium chloride 0.9 % bolus 1,000 mL (1,000 mLs Intravenous New Bag/Given 06/03/14 2256)  metoCLOPramide (REGLAN) injection 10 mg (10 mg Intravenous Given 06/03/14 2256)  diphenhydrAMINE (BENADRYL) injection 50 mg (50 mg Intravenous Given 06/03/14 2256)  ketorolac (TORADOL) 15 MG/ML injection 15 mg (15 mg Intravenous Given 06/03/14 2256)    Filed Vitals:   06/03/14 2059 06/03/14 2303  BP: 98/68 129/81  Pulse: 77 64  Temp: 98.3 F (36.8 C)   TempSrc: Oral   Resp: 17 16  SpO2: 99% 99%    Final diagnoses:  Nonintractable migraine, unspecified migraine type        Elnora Morrison, MD 06/03/14 2337

## 2014-07-01 ENCOUNTER — Inpatient Hospital Stay (HOSPITAL_COMMUNITY)
Admission: AD | Admit: 2014-07-01 | Discharge: 2014-07-01 | Disposition: A | Discharge status: Home / Self Care | Payer: Self-pay | Source: Ambulatory Visit | Attending: Obstetrics & Gynecology | Admitting: Obstetrics & Gynecology

## 2014-07-01 ENCOUNTER — Emergency Department (HOSPITAL_COMMUNITY)
Admission: EM | Admit: 2014-07-01 | Discharge: 2014-07-01 | Discharge status: Absent without leave | Payer: Self-pay | Source: Home / Self Care

## 2014-07-01 ENCOUNTER — Encounter (HOSPITAL_COMMUNITY): Payer: Self-pay | Admitting: *Deleted

## 2014-07-01 DIAGNOSIS — R102 Pelvic and perineal pain: Secondary | ICD-10-CM | POA: Insufficient documentation

## 2014-07-01 DIAGNOSIS — A599 Trichomoniasis, unspecified: Secondary | ICD-10-CM

## 2014-07-01 DIAGNOSIS — R300 Dysuria: Secondary | ICD-10-CM | POA: Insufficient documentation

## 2014-07-01 DIAGNOSIS — F17210 Nicotine dependence, cigarettes, uncomplicated: Secondary | ICD-10-CM | POA: Insufficient documentation

## 2014-07-01 LAB — URINALYSIS, ROUTINE W REFLEX MICROSCOPIC
Bilirubin Urine: NEGATIVE
GLUCOSE, UA: NEGATIVE mg/dL
Ketones, ur: NEGATIVE mg/dL
Nitrite: NEGATIVE
PROTEIN: NEGATIVE mg/dL
SPECIFIC GRAVITY, URINE: 1.025 (ref 1.005–1.030)
UROBILINOGEN UA: 0.2 mg/dL (ref 0.0–1.0)
pH: 6.5 (ref 5.0–8.0)

## 2014-07-01 LAB — WET PREP, GENITAL
TRICH WET PREP: NONE SEEN
WBC WET PREP: NONE SEEN
YEAST WET PREP: NONE SEEN

## 2014-07-01 LAB — URINE MICROSCOPIC-ADD ON

## 2014-07-01 LAB — POCT PREGNANCY, URINE: PREG TEST UR: NEGATIVE

## 2014-07-01 MED ORDER — ONDANSETRON HCL 4 MG PO TABS
4.0000 mg | ORAL_TABLET | Freq: Once | ORAL | Status: AC
  Administered 2014-07-01: 4 mg via ORAL
  Filled 2014-07-01: qty 1

## 2014-07-01 MED ORDER — PHENAZOPYRIDINE HCL 200 MG PO TABS: 200.0000 mg | ORAL_TABLET | Freq: Three times a day (TID) | ORAL | Status: DC | PRN

## 2014-07-01 MED ORDER — METRONIDAZOLE 500 MG PO TABS
2000.0000 mg | ORAL_TABLET | Freq: Once | ORAL | Status: AC
  Administered 2014-07-01: 2000 mg via ORAL
  Filled 2014-07-01: qty 4

## 2014-07-01 NOTE — MAU Note (Signed)
Real bad pain in lower abd, burns with urination,small amt of blood noted in urine.  Vag itching and burning.

## 2014-07-01 NOTE — Discharge Instructions (Signed)
Trichomoniasis Trichomoniasis is an infection caused by an organism called Trichomonas. The infection can affect both women and men. In women, the outer female genitalia and the vagina are affected. In men, the penis is mainly affected, but the prostate and other reproductive organs can also be involved. Trichomoniasis is a sexually transmitted infection (STI) and is most often passed to another person through sexual contact.  RISK FACTORS  Having unprotected sexual intercourse.  Having sexual intercourse with an infected partner. SIGNS AND SYMPTOMS  Symptoms of trichomoniasis in women include:  Abnormal gray-green frothy vaginal discharge.  Itching and irritation of the vagina.  Itching and irritation of the area outside the vagina. Symptoms of trichomoniasis in men include:   Penile discharge with or without pain.  Pain during urination. This results from inflammation of the urethra. DIAGNOSIS  Trichomoniasis may be found during a Pap test or physical exam. Your health care provider may use one of the following methods to help diagnose this infection:  Examining vaginal discharge under a microscope. For men, urethral discharge would be examined.  Testing the pH of the vagina with a test tape.  Using a vaginal swab test that checks for the Trichomonas organism. A test is available that provides results within a few minutes.  Doing a culture test for the organism. This is not usually needed. TREATMENT   You may be given medicine to fight the infection. Women should inform their health care provider if they could be or are pregnant. Some medicines used to treat the infection should not be taken during pregnancy.  Your health care provider may recommend over-the-counter medicines or creams to decrease itching or irritation.  Your sexual partner will need to be treated if infected. HOME CARE INSTRUCTIONS   Take medicines only as directed by your health care provider.  Take  over-the-counter medicine for itching or irritation as directed by your health care provider.  Do not have sexual intercourse while you have the infection.  Women should not douche or wear tampons while they have the infection.  Discuss your infection with your partner. Your partner may have gotten the infection from you, or you may have gotten it from your partner.  Have your sex partner get examined and treated if necessary.  Practice safe, informed, and protected sex.  See your health care provider for other STI testing. SEEK MEDICAL CARE IF:   You still have symptoms after you finish your medicine.  You develop abdominal pain.  You have pain when you urinate.  You have bleeding after sexual intercourse.  You develop a rash.  Your medicine makes you sick or makes you throw up (vomit). MAKE SURE YOU:  Understand these instructions.  Will watch your condition.  Will get help right away if you are not doing well or get worse. Document Released: 07/06/2000 Document Revised: 05/27/2013 Document Reviewed: 10/22/2012 [facility] Patient Information 2015 [place], [place]. This information is not intended to replace advice given to you by your health care provider. Make sure you discuss any questions you have with your health care provider.  Sexually Transmitted Disease A sexually transmitted disease (STD) is a disease or infection that may be passed (transmitted) from person to person, usually during sexual activity. This may happen by way of saliva, semen, blood, vaginal mucus, or urine. Common STDs include:   Gonorrhea.   Chlamydia.   Syphilis.   HIV and AIDS.   Genital herpes.   Hepatitis B and C.   Trichomonas.   Human papillomavirus (  HPV).   Pubic lice.   Scabies.  Mites.  Bacterial vaginosis. WHAT ARE CAUSES OF STDs? An STD may be caused by bacteria, a virus, or parasites. STDs are often transmitted during sexual activity if one person is infected.  However, they may also be transmitted through nonsexual means. STDs may be transmitted after:   Sexual intercourse with an infected person.   Sharing sex toys with an infected person.   Sharing needles with an infected person or using unclean piercing or tattoo needles.  Having intimate contact with the genitals, mouth, or rectal areas of an infected person.   Exposure to infected fluids during birth. WHAT ARE THE SIGNS AND SYMPTOMS OF STDs? Different STDs have different symptoms. Some people may not have any symptoms. If symptoms are present, they may include:   Painful or bloody urination.   Pain in the pelvis, abdomen, vagina, anus, throat, or eyes.   A skin rash, itching, or irritation.  Growths, ulcerations, blisters, or sores in the genital and anal areas.  Abnormal vaginal discharge with or without bad odor.   Penile discharge in men.   Fever.   Pain or bleeding during sexual intercourse.   Swollen glands in the groin area.   Yellow skin and eyes (jaundice). This is seen with hepatitis.   Swollen testicles.  Infertility.  Sores and blisters in the mouth. HOW ARE STDs DIAGNOSED? To make a diagnosis, your health care provider may:   Take a medical history.   Perform a physical exam.   Take a sample of any discharge to examine.  Swab the throat, cervix, opening to the penis, rectum, or vagina for testing.  Test a sample of your first morning urine.   Perform blood tests.   Perform a Pap test, if this applies.   Perform a colposcopy.   Perform a laparoscopy.  HOW ARE STDs TREATED? Treatment depends on the STD. Some STDs may be treated but not cured.   Chlamydia, gonorrhea, trichomonas, and syphilis can be cured with antibiotic medicine.   Genital herpes, hepatitis, and HIV can be treated, but not cured, with prescribed medicines. The medicines lessen symptoms.   Genital warts from HPV can be treated with medicine or by  freezing, burning (electrocautery), or surgery. Warts may come back.   HPV cannot be cured with medicine or surgery. However, abnormal areas may be removed from the cervix, vagina, or vulva.   If your diagnosis is confirmed, your recent sexual partners need treatment. This is true even if they are symptom-free or have a negative culture or evaluation. They should not have sex until their health care providers say it is okay. HOW CAN I REDUCE MY RISK OF GETTING AN STD? Take these steps to reduce your risk of getting an STD:  Use latex condoms, dental dams, and water-soluble lubricants during sexual activity. Do not use petroleum jelly or oils.  Avoid having multiple sex partners.  Do not have sex with someone who has other sex partners.  Do not have sex with anyone you do not know or who is at high risk for an STD.  Avoid risky sex practices that can break your skin.  Do not have sex if you have open sores on your mouth or skin.  Avoid drinking too much alcohol or taking illegal drugs. Alcohol and drugs can affect your judgment and put you in a vulnerable position.  Avoid engaging in oral and anal sex acts.  Get vaccinated for HPV and hepatitis. If you  have not received these vaccines in the past, talk to your health care provider about whether one or both might be right for you.   If you are at risk of being infected with HIV, it is recommended that you take a prescription medicine daily to prevent HIV infection. This is called pre-exposure prophylaxis (PrEP). You are considered at risk if:  You are a man who has sex with other men (MSM).  You are a heterosexual man or woman and are sexually active with more than one partner.  You take drugs by injection.  You are sexually active with a partner who has HIV.  Talk with your health care provider about whether you are at high risk of being infected with HIV. If you choose to begin PrEP, you should first be tested for HIV. You  should then be tested every 3 months for as long as you are taking PrEP.  WHAT SHOULD I DO IF I THINK I HAVE AN STD?  See your health care provider.   Tell your sexual partner(s). They should be tested and treated for any STDs.  Do not have sex until your health care provider says it is okay. WHEN SHOULD I GET IMMEDIATE MEDICAL CARE? Contact your health care provider right away if:   You have severe abdominal pain.  You are a man and notice swelling or pain in your testicles.  You are a woman and notice swelling or pain in your vagina. Document Released: 04/02/2002 Document Revised: 01/15/2013 Document Reviewed: 07/31/2012 [facility] Patient Information 2015 [place], [place]. This information is not intended to replace advice given to you by your health care provider. Make sure you discuss any questions you have with your health care provider.

## 2014-07-01 NOTE — MAU Note (Signed)
Julie Valentine in lab called to report urine dipstick results are not correct.  She will be changing results.

## 2014-07-01 NOTE — MAU Provider Note (Signed)
History     CSN: [id number]  Arrival date and time: 07/01/14 1512   None     Chief Complaint  Patient presents with  . Dysuria  . Abdominal Pain   HPI This is a 44 y.o. female who presents with c/o lower abdominal pain.  ALso has some burning with urination.  Saw a small amount of blood inurine.  There is also itching in vagina.  RN Note:  Expand All Collapse All   Real bad pain in lower abd, burns with urination,small amt of blood noted in urine. Vag itching and burning.          OB History    Gravida Para Term Preterm AB TAB SAB Ectopic Multiple Living   4 4 4  0 0 0 0 0 0 4      Past Medical History  Diagnosis Date  . Migraines     Past Surgical History  Procedure Laterality Date  . Liver surgery    . Tubal ligation      Family History  Problem Relation Age of Onset  . Hypertension Father     History  Substance Use Topics  . Smoking status: Current Every Day Smoker -- 0.50 packs/day  . Smokeless tobacco: Never Used  . Alcohol Use: No     Comment: Occas., social, special occas.    Allergies: No Known Allergies  Prescriptions prior to admission  Medication Sig Dispense Refill Last Dose  . acetaminophen (TYLENOL) 325 MG tablet Take 650 mg by mouth every 6 (six) hours as needed.   06/03/2014 at Unknown time  . aspirin-acetaminophen-caffeine (EXCEDRIN MIGRAINE) 250-250-65 MG per tablet Take 1 tablet by mouth every 6 (six) hours as needed for headache.   06/03/2014 at Unknown time  . medroxyPROGESTERone (PROVERA) 10 MG tablet Take 1 tablet (10 mg total) by mouth daily. (Patient not taking: Reported on 06/03/2014) 5 tablet 0 Completed Course at Unknown time  . metoCLOPramide (REGLAN) 10 MG tablet Take 1 tablet (10 mg total) by mouth every 6 (six) hours as needed for nausea (nausea/headache). 6 tablet 0     Review of Systems  Constitutional: Negative for fever, chills and malaise/fatigue.  Gastrointestinal: Positive for abdominal pain. Negative for nausea,  vomiting, diarrhea and constipation.  Genitourinary: Positive for dysuria and frequency.       Vaginal itching   Musculoskeletal: Negative for myalgias.  Neurological: Negative for dizziness, focal weakness and weakness.   Physical Exam   Blood pressure 124/74, pulse 81, temperature 98.4 F (36.9 C), temperature source Oral, resp. rate 18, height 5\' 3"  (1.6 m), weight 167 lb (75.751 kg), last menstrual period 05/04/2014.  Physical Exam  Constitutional: She is oriented to person, place, and time. She appears well-developed and well-nourished. No distress.  HENT:  Head: Normocephalic.  Cardiovascular: Normal rate, regular rhythm and normal heart sounds.  Exam reveals no gallop and no friction rub.   No murmur heard. Respiratory: Effort normal.  GI: Soft. She exhibits no distension and no mass. There is tenderness (suprapubically). There is no rebound and no guarding.  Genitourinary: Guaiac stool: foamy white, cervix closed. no cervical motion tenderness, no tenderness adnexae. Vaginal discharge found.  Musculoskeletal: Normal range of motion.  Neurological: She is alert and oriented to person, place, and time.  Skin: Skin is warm and dry.    MAU Course  Procedures  MDM Cultures done for ruleout of STD UA sent to rule out UTI Results for orders placed or performed during the hospital encounter of  07/01/14 (from the past 72 hour(s))  GC/Chlamydia probe amp ()not at [facility]     Status: None   Collection Time: 07/01/14 12:00 AM  Result Value Ref Range   Chlamydia Negative     Comment: Normal Reference Range - Negative   Neisseria gonorrhea Negative     Comment: Normal Reference Range - Negative  Urinalysis, Routine w reflex microscopic (not at [facility])     Status: Abnormal   Collection Time: 07/01/14  3:25 PM  Result Value Ref Range   Color, Urine YELLOW YELLOW   APPearance CLEAR CLEAR   Specific Gravity, Urine 1.025 1.005 - 1.030   pH 6.5 5.0 - 8.0   Glucose, UA  NEGATIVE NEGATIVE mg/dL   Hgb urine dipstick TRACE (A) NEGATIVE   Bilirubin Urine NEGATIVE NEGATIVE   Ketones, ur NEGATIVE NEGATIVE mg/dL   Protein, ur NEGATIVE NEGATIVE mg/dL    Comment: CORRECTED RESULTS CALLED TO: PASCHAL,L.ON [id number] AT 1610 BY PATELS. CORRECTED ON 06/07 AT 1627: PREVIOUSLY REPORTED AS 100    Urobilinogen, UA 0.2 0.0 - 1.0 mg/dL   Nitrite NEGATIVE NEGATIVE   Leukocytes, UA SMALL (A) NEGATIVE    Comment: CORRECTED RESULTS CALLED TO: PASCHAL,L.ON [id number] AT 1610 BY PATELS. CORRECTED ON 06/07 AT 1627: PREVIOUSLY REPORTED AS NEGATIVE   Urine microscopic-add on     Status: Abnormal   Collection Time: 07/01/14  3:25 PM  Result Value Ref Range   Squamous Epithelial / LPF FEW (A) RARE   WBC, UA 3-6 <3 WBC/hpf   RBC / HPF 11-20 <3 RBC/hpf   Bacteria, UA FEW (A) RARE   Urine-Other MUCOUS PRESENT     Comment: TRICHOMONAS PRESENT  Pregnancy, urine POC     Status: None   Collection Time: 07/01/14  3:27 PM  Result Value Ref Range   Preg Test, Ur NEGATIVE NEGATIVE    Comment:        THE SENSITIVITY OF THIS METHODOLOGY IS >24 mIU/mL   Wet prep, genital     Status: Abnormal   Collection Time: 07/01/14  5:15 PM  Result Value Ref Range   Yeast Wet Prep HPF POC NONE SEEN NONE SEEN   Trich, Wet Prep NONE SEEN NONE SEEN   Clue Cells Wet Prep HPF POC MODERATE (A) NONE SEEN   WBC, Wet Prep HPF POC NONE SEEN NONE SEEN    Comment: MANY BACTERIA SEEN  HIV antibody     Status: None   Collection Time: 07/01/14  5:55 PM  Result Value Ref Range   HIV Screen 4th Generation wRfx Non Reactive Non Reactive    Comment: (NOTE) Performed At: [facility] [place], [place] [id number] Lindon Romp MD VO:[id number]   Hepatitis B surface antigen     Status: None   Collection Time: 07/01/14  5:55 PM  Result Value Ref Range   Hepatitis B Surface Ag Negative Negative    Comment: (NOTE) Performed At: [facility] [place], [place]  [id number] Lindon Romp MD [id number]      Assessment and Plan  A;  Pelvic pain      Dysuria      Trichomonas found in urine      May clue cell in vagina, suggesting bacterial vaginosis  P  Informed of results      No over riding sign of UTI      Will treat for Trichomonas while here       Safe sex wotj abstinence for 2-3  weeks       Recommend treatment for partner, offered Rx but she states he will go to [facility]      Follow up as needed        [facility] 07/01/2014, 4:56 PM

## 2014-07-02 LAB — GC/CHLAMYDIA PROBE AMP (~~LOC~~) NOT AT ARMC
Chlamydia: NEGATIVE
Neisseria Gonorrhea: NEGATIVE

## 2014-07-02 LAB — HIV ANTIBODY (ROUTINE TESTING W REFLEX): HIV Screen 4th Generation wRfx: NONREACTIVE

## 2014-07-02 LAB — HEPATITIS B SURFACE ANTIGEN: Hepatitis B Surface Ag: NEGATIVE

## 2014-11-17 ENCOUNTER — Ambulatory Visit: Payer: Self-pay

## 2014-11-20 ENCOUNTER — Ambulatory Visit: Payer: Self-pay

## 2014-12-04 ENCOUNTER — Ambulatory Visit: Payer: Medicaid Other | Attending: Internal Medicine

## 2014-12-04 ENCOUNTER — Ambulatory Visit: Payer: Self-pay

## 2014-12-09 ENCOUNTER — Inpatient Hospital Stay (HOSPITAL_COMMUNITY)
Admission: AD | Admit: 2014-12-09 | Discharge: 2014-12-10 | Disposition: A | Discharge status: Home / Self Care | Payer: Self-pay | Source: Ambulatory Visit | Attending: Obstetrics & Gynecology | Admitting: Obstetrics & Gynecology

## 2014-12-09 ENCOUNTER — Inpatient Hospital Stay (HOSPITAL_COMMUNITY): Payer: Medicaid Other

## 2014-12-09 DIAGNOSIS — Z7982 Long term (current) use of aspirin: Secondary | ICD-10-CM | POA: Insufficient documentation

## 2014-12-09 DIAGNOSIS — R1030 Lower abdominal pain, unspecified: Secondary | ICD-10-CM | POA: Insufficient documentation

## 2014-12-09 DIAGNOSIS — Z79899 Other long term (current) drug therapy: Secondary | ICD-10-CM | POA: Insufficient documentation

## 2014-12-09 DIAGNOSIS — N739 Female pelvic inflammatory disease, unspecified: Secondary | ICD-10-CM

## 2014-12-09 DIAGNOSIS — R102 Pelvic and perineal pain: Secondary | ICD-10-CM

## 2014-12-09 DIAGNOSIS — F17200 Nicotine dependence, unspecified, uncomplicated: Secondary | ICD-10-CM | POA: Insufficient documentation

## 2014-12-09 DIAGNOSIS — Z9851 Tubal ligation status: Secondary | ICD-10-CM | POA: Insufficient documentation

## 2014-12-09 DIAGNOSIS — Z8249 Family history of ischemic heart disease and other diseases of the circulatory system: Secondary | ICD-10-CM | POA: Insufficient documentation

## 2014-12-09 LAB — URINALYSIS, ROUTINE W REFLEX MICROSCOPIC
BILIRUBIN URINE: NEGATIVE
Glucose, UA: NEGATIVE mg/dL
Ketones, ur: NEGATIVE mg/dL
Leukocytes, UA: NEGATIVE
Nitrite: NEGATIVE
PH: 6 (ref 5.0–8.0)
Protein, ur: NEGATIVE mg/dL
Specific Gravity, Urine: 1.03 (ref 1.005–1.030)

## 2014-12-09 LAB — HCG, SERUM, QUALITATIVE: Preg, Serum: NEGATIVE

## 2014-12-09 LAB — CBC
HCT: 37.6 % (ref 36.0–46.0)
HEMOGLOBIN: 12.9 g/dL (ref 12.0–15.0)
MCH: 33.5 pg (ref 26.0–34.0)
MCHC: 34.3 g/dL (ref 30.0–36.0)
MCV: 97.7 fL (ref 78.0–100.0)
Platelets: 297 10*3/uL (ref 150–400)
RBC: 3.85 MIL/uL — ABNORMAL LOW (ref 3.87–5.11)
RDW: 13.9 % (ref 11.5–15.5)
WBC: 4.6 10*3/uL (ref 4.0–10.5)

## 2014-12-09 LAB — COMPREHENSIVE METABOLIC PANEL
ALK PHOS: 65 U/L (ref 38–126)
ALT: 17 U/L (ref 14–54)
AST: 17 U/L (ref 15–41)
Albumin: 3.8 g/dL (ref 3.5–5.0)
Anion gap: 7 (ref 5–15)
BUN: 13 mg/dL (ref 6–20)
CO2: 26 mmol/L (ref 22–32)
Calcium: 9.3 mg/dL (ref 8.9–10.3)
Chloride: 109 mmol/L (ref 101–111)
Creatinine, Ser: 0.95 mg/dL (ref 0.44–1.00)
GFR calc Af Amer: >60 mL/min (ref >60)
GFR calc non Af Amer: >60 mL/min (ref >60)
GLUCOSE: 78 mg/dL (ref 65–99)
POTASSIUM: 4.3 mmol/L (ref 3.5–5.1)
SODIUM: 142 mmol/L (ref 135–145)
Total Bilirubin: 0.3 mg/dL (ref 0.3–1.2)
Total Protein: 7.2 g/dL (ref 6.5–8.1)

## 2014-12-09 LAB — WET PREP, GENITAL
Sperm: NONE SEEN
Trich, Wet Prep: NONE SEEN
WBC, Wet Prep HPF POC: NONE SEEN
YEAST WET PREP: NONE SEEN

## 2014-12-09 LAB — URINE MICROSCOPIC-ADD ON

## 2014-12-09 LAB — AMYLASE: Amylase: 60 U/L (ref 28–100)

## 2014-12-09 LAB — LIPASE, BLOOD: Lipase: 40 U/L (ref 11–51)

## 2014-12-09 MED ORDER — DOXYCYCLINE HYCLATE 100 MG PO TABS: 100.0000 mg | ORAL_TABLET | Freq: Once | ORAL | Status: DC

## 2014-12-09 MED ORDER — AZITHROMYCIN 250 MG PO TABS: 1000.0000 mg | ORAL_TABLET | Freq: Once | ORAL | Status: DC

## 2014-12-09 MED ORDER — DEXTROSE 5 % IV SOLN
1.0000 g | Freq: Once | INTRAVENOUS | Status: AC
  Administered 2014-12-09: 1 g via INTRAVENOUS
  Filled 2014-12-09: qty 10

## 2014-12-09 MED ORDER — GI COCKTAIL ~~LOC~~
30.0000 mL | Freq: Once | ORAL | Status: AC
  Administered 2014-12-09: 30 mL via ORAL
  Filled 2014-12-09: qty 30

## 2014-12-09 MED ORDER — ONDANSETRON HCL 4 MG/2ML IJ SOLN
4.0000 mg | Freq: Once | INTRAMUSCULAR | Status: AC
  Administered 2014-12-09: 4 mg via INTRAVENOUS
  Filled 2014-12-09: qty 2

## 2014-12-09 MED ORDER — SODIUM CHLORIDE 0.9 % IV SOLN
INTRAVENOUS | Status: DC
  Administered 2014-12-09: 23:00:00 via INTRAVENOUS

## 2014-12-09 MED ORDER — KETOROLAC TROMETHAMINE 60 MG/2ML IM SOLN
60.0000 mg | Freq: Once | INTRAMUSCULAR | Status: AC
  Administered 2014-12-09: 60 mg via INTRAMUSCULAR
  Filled 2014-12-09: qty 2

## 2014-12-09 MED ORDER — NAPROXEN 500 MG PO TABS: 500.0000 mg | ORAL_TABLET | Freq: Two times a day (BID) | ORAL | Status: DC

## 2014-12-09 MED ORDER — PROMETHAZINE HCL 25 MG PO TABS
25.0000 mg | ORAL_TABLET | Freq: Four times a day (QID) | ORAL | Status: DC | PRN
Start: 2014-12-09 — End: 2015-06-09

## 2014-12-09 MED ORDER — AZITHROMYCIN 250 MG PO TABS
1000.0000 mg | ORAL_TABLET | Freq: Once | ORAL | Status: AC
  Administered 2014-12-09: 1000 mg via ORAL
  Filled 2014-12-09: qty 4

## 2014-12-09 MED ORDER — DIPHENHYDRAMINE HCL 25 MG PO TABS: 25.0000 mg | ORAL_TABLET | Freq: Four times a day (QID) | ORAL | Status: DC

## 2014-12-09 NOTE — MAU Provider Note (Signed)
History     CSN: [id number]  Arrival date and time: 12/09/14 1648   None     Chief Complaint  Patient presents with  . Abdominal Pain   HPI Pt is not pregnant with neg UPT here and one HPT neg and one positive HPT.  Pt has missed 2 periods. Pt has had a BTL.(review of records shows that pt was given provera in May 2016)-pt has been to [facility] previously  Pt has lower abdominal pain that started last night . Pt has had some nausea associated with it. Pt took tylenol and ibuprofen last night which helped minimallly.  Pt denies vaginal bleeding or discharge or dysuria, constipation or diarrhea. Pt also has some RUQ pain- pt has hx of partial hepatatectomy at [employer] RN note:  Expand All Collapse All   Pt had one pos HPT & one neg HPT, now having lower abd pain since last night. Pt took tylenol & ibuprofen, helped minimally. Denies bleeding or discharge. No dysuria.       Past Medical History  Diagnosis Date  . Migraines     Past Surgical History  Procedure Laterality Date  . Liver surgery    . Tubal ligation      Family History  Problem Relation Age of Onset  . Hypertension Father     Social History  Substance Use Topics  . Smoking status: Current Every Day Smoker -- 0.50 packs/day  . Smokeless tobacco: Never Used  . Alcohol Use: No     Comment: Occas., social, special occas.    Allergies: No Known Allergies  Prescriptions prior to admission  Medication Sig Dispense Refill Last Dose  . acetaminophen (TYLENOL) 325 MG tablet Take 650 mg by mouth every 6 (six) hours as needed for moderate pain or headache.    Not Taking at Unknown time  . aspirin-acetaminophen-caffeine (EXCEDRIN MIGRAINE) 250-250-65 MG per tablet Take 1 tablet by mouth every 6 (six) hours as needed for headache.   Not Taking at Unknown time  . medroxyPROGESTERone (PROVERA) 10 MG tablet Take 1 tablet (10 mg total) by mouth daily. (Patient not taking: Reported on 06/03/2014) 5 tablet 0  Completed Course at Unknown time  . metoCLOPramide (REGLAN) 10 MG tablet Take 1 tablet (10 mg total) by mouth every 6 (six) hours as needed for nausea (nausea/headache). (Patient not taking: Reported on 07/01/2014) 6 tablet 0 Not Taking at Unknown time  . phenazopyridine (PYRIDIUM) 200 MG tablet Take 1 tablet (200 mg total) by mouth 3 (three) times daily as needed for pain (urethral spasm). 10 tablet 1     Review of Systems  Constitutional: Negative for fever and chills.  Gastrointestinal: Positive for heartburn, nausea and abdominal pain. Negative for vomiting, diarrhea and constipation.       Pt has LUQ pain as well as diffuse lower abdominal pain  Genitourinary: Positive for frequency. Negative for dysuria, urgency and flank pain.  Musculoskeletal: Positive for back pain. Negative for myalgias and neck pain.  Neurological: Negative for headaches.   Physical Exam   Blood pressure 107/82, pulse 83, temperature 98.1 F (36.7 C), temperature source Oral, resp. rate 16, last menstrual period 09/25/2014.  Physical Exam  Nursing note and vitals reviewed. Constitutional: She is oriented to person, place, and time. She appears well-developed and well-nourished. No distress.  HENT:  Head: Normocephalic.  Eyes: Pupils are equal, round, and reactive to light.  Neck: Normal range of motion. Neck supple.  Cardiovascular: Normal rate.   Respiratory: Effort  normal.  GI: Soft. She exhibits no distension. There is tenderness. There is guarding. There is no rebound.  Mildly tender RUQ; diffuse lower abdominal tenderness; no rebound  Genitourinary:  Vagina clean; cervix clean +CMT; uterus and adnexa painful with bimanual- no appreciable enlargement  Musculoskeletal: Normal range of motion.  Neurological: She is alert and oriented to person, place, and time.  Skin: Skin is warm and dry.  Psychiatric: She has a normal mood and affect.    MAU Course  Procedures Neg UPT- qualitative HCG  drawn Results for orders placed or performed during the hospital encounter of 12/09/14 (from the past 24 hour(s))  Urinalysis, Routine w reflex microscopic (not at [facility])     Status: Abnormal   Collection Time: 12/09/14  5:05 PM  Result Value Ref Range   Color, Urine YELLOW YELLOW   APPearance CLEAR CLEAR   Specific Gravity, Urine 1.030 1.005 - 1.030   pH 6.0 5.0 - 8.0   Glucose, UA NEGATIVE NEGATIVE mg/dL   Hgb urine dipstick TRACE (A) NEGATIVE   Bilirubin Urine NEGATIVE NEGATIVE   Ketones, ur NEGATIVE NEGATIVE mg/dL   Protein, ur NEGATIVE NEGATIVE mg/dL   Nitrite NEGATIVE NEGATIVE   Leukocytes, UA NEGATIVE NEGATIVE  Urine microscopic-add on     Status: Abnormal   Collection Time: 12/09/14  5:05 PM  Result Value Ref Range   Squamous Epithelial / LPF 0-5 (A) NONE SEEN   WBC, UA 0-5 0 - 5 WBC/hpf   RBC / HPF 0-5 0 - 5 RBC/hpf   Bacteria, UA RARE (A) NONE SEEN  hCG, serum, qualitative     Status: None   Collection Time: 12/09/14  6:24 PM  Result Value Ref Range   Preg, Serum NEGATIVE NEGATIVE  CBC     Status: Abnormal   Collection Time: 12/09/14  6:24 PM  Result Value Ref Range   WBC 4.6 4.0 - 10.5 K/uL   RBC 3.85 (L) 3.87 - 5.11 MIL/uL   Hemoglobin 12.9 12.0 - 15.0 g/dL   HCT 37.6 36.0 - 46.0 %   MCV 97.7 78.0 - 100.0 fL   MCH 33.5 26.0 - 34.0 pg   MCHC 34.3 30.0 - 36.0 g/dL   RDW 13.9 11.5 - 15.5 %   Platelets 297 150 - 400 K/uL  Wet prep, genital     Status: Abnormal   Collection Time: 12/09/14  8:50 PM  Result Value Ref Range   Yeast Wet Prep HPF POC NONE SEEN NONE SEEN   Trich, Wet Prep NONE SEEN NONE SEEN   Clue Cells Wet Prep HPF POC PRESENT (A) NONE SEEN   WBC, Wet Prep HPF POC NONE SEEN NONE SEEN   Sperm NONE SEEN   GC/chlamydia results pending Due to extreme pain, [place] ordered-  CMET and amylase and lipase added on due to pt's hx of partial hepatatectomy Toradol 60mg  IM and GI cocktail ordered Care turned over to Jorje Guild, NP Assessment and Plan  Lower  Abdominal pain Amenorrhea for 2 months- negative serum pregnancy test  [facility] 12/09/2014, 6:05 PM

## 2014-12-09 NOTE — MAU Note (Signed)
Pt had one pos HPT & one neg HPT, now having lower abd pain since last night.  Pt took tylenol & ibuprofen, helped minimally.  Denies bleeding or discharge.  No dysuria.

## 2014-12-09 NOTE — Discharge Instructions (Signed)
Uterine Fibroids Uterine fibroids are tissue masses (tumors). They are also called leiomyomas. They can develop inside of a woman's womb (uterus). They can grow very large. Fibroids are not cancerous (benign). Most fibroids do not require medical treatment. HOME CARE  Keep all follow-up visits as told by your doctor. This is important.  Take medicines only as told by your doctor.  If you were prescribed a hormone treatment, take the hormone medicines exactly as told.  Do not take aspirin. It can cause bleeding.  Ask your doctor about taking iron pills and increasing the amount of dark green, leafy vegetables in your diet. These actions can help to boost your blood iron levels.  Pay close attention to your period. Tell your doctor about any changes, such as:  Increased blood flow. This may require you to use more pads or tampons than usual per month.  A change in the number of days that your period lasts per month.  A change in symptoms that come with your period, such as back pain or cramping in your belly area (abdomen). GET HELP IF:  You have pain in your back or the area between your hip bones (pelvic area) that is not controlled by medicines.  You have pain in your abdomen that is not controlled with medicines.  You have an increase in bleeding between and during periods.  You soak tampons or pads in a half hour or less.  You feel lightheaded.  You feel extra tired.  You feel weak. GET HELP RIGHT AWAY IF:   You pass out (faint).  You have a sudden increase in pelvic pain.   This information is not intended to replace advice given to you by your health care provider. Make sure you discuss any questions you have with your health care provider.   Document Released: 02/12/2010 Document Revised: 01/31/2014 Document Reviewed: 07/09/2013 Elsevier Interactive Patient Education 2016 Elsevier Inc. Pelvic Inflammatory Disease Pelvic inflammatory disease (PID) is an infection  in some or all of the female organs. PID can be in the uterus, ovaries, fallopian tubes, or the surrounding tissues that are inside the lower belly area (pelvis). PID can lead to lasting problems if it is not treated. To check for this disease, your doctor may:  Do a physical exam.  Do blood tests, urine tests, or a pregnancy test.  Look at your vaginal discharge.  Do tests to look inside the pelvis.  Test you for other infections. HOME CARE  Take over-the-counter and prescription medicines only as told by your doctor.  If you were prescribed an antibiotic medicine, take it as told by your doctor. Do not stop taking it even if you start to feel better.  Do not have sex until treatment is done or as told by your doctor.  Tell your sex partner if you have PID. Your partner may need to be treated.  Keep all follow-up visits as told by your doctor. This is important.  Your doctor may test you for infection again 3 months after you are treated. GET HELP IF:  You have more fluid (discharge) coming from your vagina or fluid that is not normal.  Your pain does not improve.  You throw up (vomit).  You have a fever.  You cannot take your medicines.  Your partner has a sexually transmitted disease (STD).  You have pain when you pee (urinate). GET HELP RIGHT AWAY IF:  You have more belly (abdominal) or lower belly pain.  You have chills.  You are not better after 72 hours.   This information is not intended to replace advice given to you by your health care provider. Make sure you discuss any questions you have with your health care provider.   Document Released: 04/08/2008 Document Revised: 10/01/2014 Document Reviewed: 02/17/2014 Elsevier Interactive Patient Education 2016 Elsevier Inc. Pelvic Pain, Female Pelvic pain is pain felt below the belly button and between your hips. It can be caused by many different things. It is important to get help right away. This is  especially true for severe, sharp, or unusual pain that comes on suddenly.  HOME CARE  Only take medicine as told by your doctor.  Rest as told by your doctor.  Eat a healthy diet, such as fruits, vegetables, and lean meats.  Drink enough fluids to keep your pee (urine) clear or pale yellow, or as told.  Avoid sex (intercourse) if it causes pain.  Apply warm or cold packs to your lower belly (abdomen). Use the type of pack that helps the pain.  Avoid situations that cause you stress.  Keep a journal to track your pain. Write down:  When the pain started.  Where it is located.  If there are things that seem to be related to the pain, such as food or your period.  Follow up with your doctor as told. GET HELP RIGHT AWAY IF:   You have heavy bleeding from the vagina.  You have more pelvic pain.  You feel lightheaded or pass out (faint).  You have chills.  You have pain when you pee or have blood in your pee.  You cannot stop having watery poop (diarrhea).  You cannot stop throwing up (vomiting).  You have a fever or lasting symptoms for more than 3 days.  You have a fever and your symptoms suddenly get worse.  You are being physically or sexually abused.  Your medicine does not help your pain.  You have fluid (discharge) coming from your vagina that is not normal. MAKE SURE YOU:  Understand these instructions.  Will watch your condition.  Will get help if you are not doing well or get worse.   This information is not intended to replace advice given to you by your health care provider. Make sure you discuss any questions you have with your health care provider.   Document Released: 06/29/2007 Document Revised: 01/31/2014 Document Reviewed: 05/02/2011 Elsevier Interactive Patient Education [employer].

## 2014-12-10 LAB — HIV ANTIBODY (ROUTINE TESTING W REFLEX): HIV Screen 4th Generation wRfx: NONREACTIVE

## 2014-12-10 LAB — GC/CHLAMYDIA PROBE AMP (~~LOC~~) NOT AT ARMC
Chlamydia: NEGATIVE
Neisseria Gonorrhea: NEGATIVE

## 2015-06-09 ENCOUNTER — Encounter (HOSPITAL_COMMUNITY): Payer: Self-pay | Admitting: Emergency Medicine

## 2015-06-09 ENCOUNTER — Emergency Department (HOSPITAL_COMMUNITY): Payer: Self-pay

## 2015-06-09 ENCOUNTER — Emergency Department (HOSPITAL_COMMUNITY)
Admission: EM | Admit: 2015-06-09 | Discharge: 2015-06-09 | Disposition: A | Discharge status: Home / Self Care | Payer: Self-pay | Attending: Emergency Medicine | Admitting: Emergency Medicine

## 2015-06-09 DIAGNOSIS — F17200 Nicotine dependence, unspecified, uncomplicated: Secondary | ICD-10-CM | POA: Insufficient documentation

## 2015-06-09 DIAGNOSIS — R109 Unspecified abdominal pain: Secondary | ICD-10-CM | POA: Insufficient documentation

## 2015-06-09 DIAGNOSIS — Z79899 Other long term (current) drug therapy: Secondary | ICD-10-CM | POA: Insufficient documentation

## 2015-06-09 LAB — COMPREHENSIVE METABOLIC PANEL
ALT: 15 U/L (ref 14–54)
AST: 17 U/L (ref 15–41)
Albumin: 4 g/dL (ref 3.5–5.0)
Alkaline Phosphatase: 64 U/L (ref 38–126)
Anion gap: 7 (ref 5–15)
BUN: 11 mg/dL (ref 6–20)
CO2: 24 mmol/L (ref 22–32)
Calcium: 8.9 mg/dL (ref 8.9–10.3)
Chloride: 105 mmol/L (ref 101–111)
Creatinine, Ser: 0.92 mg/dL (ref 0.44–1.00)
GFR calc Af Amer: >60 mL/min (ref >60)
GFR calc non Af Amer: >60 mL/min (ref >60)
Glucose, Bld: 91 mg/dL (ref 65–99)
Potassium: 3.5 mmol/L (ref 3.5–5.1)
Sodium: 136 mmol/L (ref 135–145)
Total Bilirubin: 0.6 mg/dL (ref 0.3–1.2)
Total Protein: 7.3 g/dL (ref 6.5–8.1)

## 2015-06-09 LAB — I-STAT BETA HCG BLOOD, ED (MC, WL, AP ONLY): I-stat hCG, quantitative: <5 m[IU]/mL (ref <5)

## 2015-06-09 LAB — URINALYSIS, ROUTINE W REFLEX MICROSCOPIC
Glucose, UA: NEGATIVE mg/dL
Hgb urine dipstick: NEGATIVE
Ketones, ur: NEGATIVE mg/dL
Leukocytes, UA: NEGATIVE
Nitrite: NEGATIVE
Protein, ur: NEGATIVE mg/dL
Specific Gravity, Urine: 1.031 — ABNORMAL HIGH (ref 1.005–1.030)
pH: 5.5 (ref 5.0–8.0)

## 2015-06-09 LAB — CBC
HCT: 36.3 % (ref 36.0–46.0)
Hemoglobin: 12.8 g/dL (ref 12.0–15.0)
MCH: 33.2 pg (ref 26.0–34.0)
MCHC: 35.3 g/dL (ref 30.0–36.0)
MCV: 94.3 fL (ref 78.0–100.0)
Platelets: 284 10*3/uL (ref 150–400)
RBC: 3.85 MIL/uL — ABNORMAL LOW (ref 3.87–5.11)
RDW: 13.7 % (ref 11.5–15.5)
WBC: 4.7 10*3/uL (ref 4.0–10.5)

## 2015-06-09 LAB — LIPASE, BLOOD: Lipase: 20 U/L (ref 11–51)

## 2015-06-09 MED ORDER — HYDROMORPHONE HCL 1 MG/ML IJ SOLN
1.0000 mg | Freq: Once | INTRAMUSCULAR | Status: AC
  Administered 2015-06-09: 1 mg via INTRAVENOUS
  Filled 2015-06-09: qty 1

## 2015-06-09 MED ORDER — ONDANSETRON HCL 4 MG PO TABS: 4.0000 mg | ORAL_TABLET | Freq: Four times a day (QID) | ORAL | Status: DC

## 2015-06-09 MED ORDER — IOPAMIDOL (ISOVUE-300) INJECTION 61%
100.0000 mL | Freq: Once | INTRAVENOUS | Status: AC | PRN
  Administered 2015-06-09: 100 mL via INTRAVENOUS

## 2015-06-09 MED ORDER — SODIUM CHLORIDE 0.9 % IV BOLUS (SEPSIS)
1000.0000 mL | Freq: Once | INTRAVENOUS | Status: AC
Start: 2015-06-09 — End: 2015-06-09
  Administered 2015-06-09: 1000 mL via INTRAVENOUS

## 2015-06-09 MED ORDER — DICYCLOMINE HCL 10 MG PO CAPS
10.0000 mg | ORAL_CAPSULE | Freq: Once | ORAL | Status: AC
  Administered 2015-06-09: 10 mg via ORAL
  Filled 2015-06-09: qty 1

## 2015-06-09 MED ORDER — TRAMADOL HCL 50 MG PO TABS: 50.0000 mg | ORAL_TABLET | Freq: Four times a day (QID) | ORAL | Status: DC | PRN

## 2015-06-09 MED ORDER — ONDANSETRON HCL 4 MG/2ML IJ SOLN
4.0000 mg | Freq: Once | INTRAMUSCULAR | Status: AC
  Administered 2015-06-09: 4 mg via INTRAVENOUS
  Filled 2015-06-09: qty 2

## 2015-06-09 MED ORDER — ONDANSETRON 4 MG PO TBDP
4.0000 mg | ORAL_TABLET | Freq: Once | ORAL | Status: AC | PRN
  Administered 2015-06-09: 4 mg via ORAL
  Filled 2015-06-09: qty 1

## 2015-06-09 NOTE — ED Notes (Signed)
Pt states she has had abdominal pain with N/V/D since last night. Also states she was running a fever last night. Took tylenol. Alert and oriented.

## 2015-06-09 NOTE — Discharge Instructions (Signed)

## 2015-06-17 NOTE — ED Provider Notes (Addendum)
CSN: [id number]     Arrival date & time 06/09/15  1547 History   First MD Initiated Contact with Patient 06/09/15 1644     Chief Complaint  Patient presents with  . Abdominal Pain     (Consider location/radiation/quality/duration/timing/severity/associated sxs/prior Treatment) HPI  45 year old female with abdominal pain. Crampy and occasionally sharper. Associated nausea, vomiting and diarrhea since last night. Subjective fever. No urinary complaints. No blood in stool or emesis. No sick contacts. Tried taking Tylenol with only mild improvement.  Past Medical History  Diagnosis Date  . Migraines    Past Surgical History  Procedure Laterality Date  . Liver surgery    . Tubal ligation     Family History  Problem Relation Age of Onset  . Hypertension Father    Social History  Substance Use Topics  . Smoking status: Current Every Day Smoker -- 0.50 packs/day  . Smokeless tobacco: Never Used  . Alcohol Use: No     Comment: Occas., social, special occas.   OB History    Gravida Para Term Preterm AB TAB SAB Ectopic Multiple Living   4 4 4  0 0 0 0 0 0 4     Review of Systems  All systems reviewed and negative, other than as noted in HPI.   Allergies  Review of patient's allergies indicates no known allergies.  Home Medications   Prior to Admission medications   Medication Sig Start Date End Date Taking? Authorizing Provider  acetaminophen (TYLENOL) 500 MG tablet Take 1,000 mg by mouth every 6 (six) hours as needed for moderate pain.   Yes Historical Provider, MD  ondansetron (ZOFRAN) 4 MG tablet Take 1 tablet (4 mg total) by mouth every 6 (six) hours. 06/09/15   Virgel Manifold, MD  traMADol (ULTRAM) 50 MG tablet Take 1 tablet (50 mg total) by mouth every 6 (six) hours as needed. 06/09/15   Virgel Manifold, MD   BP 105/65 mmHg  Pulse 68  Temp(Src) 97.8 F (36.6 C) (Oral)  Resp 18  Ht 5\' 4"  (1.626 m)  Wt 168 lb (76.204 kg)  BMI 28.82 kg/m2  SpO2 95% Physical Exam   Constitutional: She appears well-developed and well-nourished. No distress.  HENT:  Head: Normocephalic and atraumatic.  Eyes: Conjunctivae are normal. Right eye exhibits no discharge. Left eye exhibits no discharge.  Neck: Neck supple.  Cardiovascular: Normal rate, regular rhythm and normal heart sounds.  Exam reveals no gallop and no friction rub.   No murmur heard. Pulmonary/Chest: Effort normal and breath sounds normal. No respiratory distress.  Abdominal: Soft. She exhibits no distension. There is tenderness.  Mild tenderness right upper and lower quadrants. No rebound or guarding.  Genitourinary:  His CVA tenderness  Musculoskeletal: She exhibits no edema or tenderness.  Neurological: She is alert.  Skin: Skin is warm and dry.  Psychiatric: She has a normal mood and affect. Her behavior is normal. Thought content normal.  Nursing note and vitals reviewed.   ED Course  Procedures (including critical care time) Labs Review Labs Reviewed  CBC - Abnormal; Notable for the following:    RBC 3.85 (*)    All other components within normal limits  URINALYSIS, ROUTINE W REFLEX MICROSCOPIC (NOT AT [facility]) - Abnormal; Notable for the following:    Color, Urine AMBER (*)    APPearance CLOUDY (*)    Specific Gravity, Urine 1.031 (*)    Bilirubin Urine SMALL (*)    All other components within normal limits  LIPASE, BLOOD  COMPREHENSIVE METABOLIC PANEL  I-STAT BETA HCG BLOOD, ED ([facility], [facility], [facility] ONLY)    Imaging Review No results found. I have personally reviewed and evaluated these images and lab results as part of my medical decision-making.   EKG Interpretation None      MDM   Final diagnoses:  Abdominal pain, unspecified abdominal location    45 year old female with abdominal pain. Tenderness on exam but no peritoneal signs. Doubt acute surgical process. Workup fairly unremarkable including CT and labs. She is not pregnant. Afebrile. He dynamically stable. Symptoms improved  prior to discharge.It has been determined that no acute conditions requiring further emergency intervention are present at this time. The patient has been advised of the diagnosis and plan. I reviewed any labs and imaging including any potential incidental findings. We have discussed signs and symptoms that warrant return to the ED and they are listed in the discharge instructions.      Virgel Manifold, MD 06/17/15 [place], MD 06/17/15 [phone]

## 2015-09-21 ENCOUNTER — Ambulatory Visit: Payer: Self-pay | Admitting: Obstetrics & Gynecology

## 2015-10-08 ENCOUNTER — Encounter: Payer: Self-pay | Admitting: Obstetrics and Gynecology

## 2015-10-08 ENCOUNTER — Ambulatory Visit (INDEPENDENT_AMBULATORY_CARE_PROVIDER_SITE_OTHER): Payer: Medicaid Other | Admitting: Obstetrics and Gynecology

## 2015-10-08 VITALS — BP 93/64 | HR 86 | Temp 98.1°F | Ht 64.0 in | Wt 186.8 lb

## 2015-10-08 DIAGNOSIS — Z3049 Encounter for surveillance of other contraceptives: Secondary | ICD-10-CM | POA: Diagnosis not present

## 2015-10-08 DIAGNOSIS — N912 Amenorrhea, unspecified: Secondary | ICD-10-CM

## 2015-10-08 DIAGNOSIS — R300 Dysuria: Secondary | ICD-10-CM

## 2015-10-08 DIAGNOSIS — Z01419 Encounter for gynecological examination (general) (routine) without abnormal findings: Secondary | ICD-10-CM

## 2015-10-08 LAB — POCT URINE PREGNANCY: Preg Test, Ur: NEGATIVE

## 2015-10-08 NOTE — Progress Notes (Signed)
Subjective:     Julie Valentine is a 45 y.o. female here for a routine exam.  Current complaints: Low abd cramping and increased urinary freq.  Pt reports H/o fibroids as well. No cycle for the last 4 months. Occ menopausal Sx, able to handle. Personal health questionnaire reviewed: yes.   Gynecologic History Patient's last menstrual period was 06/04/2015 (exact date). Contraception: tubal ligation Last Pap: 1 1/2 yrs. Results were: normal Last mammogram: unknown. Results were: unknown  Obstetric History OB History  Gravida Para Term Preterm AB Living  4 4 4  0 0 4  SAB TAB Ectopic Multiple Live Births  0 0 0 0      # Outcome Date GA Lbr Len/2nd Weight Sex Delivery Anes PTL Lv  4 Term           3 Term           2 Term           1 Term                The following portions of the patient's history were reviewed and updated as appropriate:   Review of Systems   Objective:    BP 93/64   Pulse 86   Temp 98.1 F (36.7 C)   Ht 5\' 4"  (1.626 m)   Wt 186 lb 12.8 oz (84.7 kg)   LMP 06/04/2015 (Exact Date)   BMI 32.06 kg/m   General Appearance:    Alert, cooperative, no distress, appears stated age  Head:    Normocephalic, without obvious abnormality, atraumatic  Eyes:    PERRL, conjunctiva/corneas clear, EOM's intact, fundi    benign, both eyes  Ears:    Normal TM's and external ear canals, both ears  Nose:   Nares normal, septum midline, mucosa normal, no drainage    or sinus tenderness  Throat:   Lips, mucosa, and tongue normal; teeth and gums normal  Neck:   Supple, symmetrical, trachea midline, no adenopathy;    thyroid:  no enlargement/tenderness/nodules; no carotid   bruit or JVD  Back:     Symmetric, no curvature, ROM normal, no CVA tenderness  Lungs:     Clear to auscultation bilaterally, respirations unlabored  Chest Wall:    No tenderness or deformity   Heart:    Regular rate and rhythm, S1 and S2 normal, no murmur, rub   or gallop  Breast Exam:    No tenderness,  masses, or nipple abnormality  Abdomen:     Soft, non-tender, bowel sounds active all four quadrants,    no masses, no organomegaly  Genitalia:    Normal female without lesion, discharge or tenderness, bladder tenderness, uterus small mobile non tender, no adnexal masses  Rectal:  Not performed  Extremities:   Extremities normal, atraumatic, no cyanosis or edema  Pulses:   2+ and symmetric all extremities  Skin:   Skin color, texture, turgor normal, no rashes or lesions  Lymph nodes:   Cervical, supraclavicular, and axillary nodes normal  Neurologic:   CNII-XII intact, normal strength, sensation and reflexes    throughout       Assessment:    Healthy female exam.    STD exposure   Dysuria Amenorrhea   Plan:    Mammogram ordered.  STD testing as per pt request. UPT negative today. Urine cultured ordered, I suspect this is the cause of pt's abd pain.

## 2015-10-10 LAB — URINE CULTURE

## 2015-10-10 LAB — GC/CHLAMYDIA PROBE AMP
CHLAMYDIA, DNA PROBE: NEGATIVE
Neisseria gonorrhoeae by PCR: NEGATIVE

## 2015-10-12 LAB — PAP IG W/ RFLX HPV ASCU: PAP SMEAR COMMENT: 0

## 2015-10-16 ENCOUNTER — Other Ambulatory Visit: Payer: Self-pay | Admitting: *Deleted

## 2015-10-16 DIAGNOSIS — N390 Urinary tract infection, site not specified: Secondary | ICD-10-CM

## 2015-10-16 MED ORDER — NITROFURANTOIN MONOHYD MACRO 100 MG PO CAPS: 100.0000 mg | ORAL_CAPSULE | Freq: Two times a day (BID) | ORAL | 0 refills | Status: DC

## 2015-10-16 MED ORDER — PHENAZOPYRIDINE HCL 200 MG PO TABS: 200.0000 mg | ORAL_TABLET | Freq: Three times a day (TID) | ORAL | 0 refills | Status: AC

## 2015-10-16 NOTE — Progress Notes (Signed)
Pt request something to relieve symptoms of UTI, Macrobid sent in earlier today.  Pt made aware, Pyridium could be sent in per protocol. Pt advised that her urine may change colors. Pt advised if no change in symptoms to call office.

## 2015-10-16 NOTE — Progress Notes (Signed)
Pt called to office for lab result.  Pt is +Ecoli in urine.  Reviewed with Dr Jodi Mourning, ordered Macrobid 100mg  to be sent to pharmacy.

## 2015-10-21 ENCOUNTER — Telehealth: Payer: Self-pay | Admitting: *Deleted

## 2015-10-21 NOTE — Telephone Encounter (Signed)
Left voicemail message for patient to call I have a question about a medication she is taking.

## 2015-10-23 ENCOUNTER — Ambulatory Visit: Payer: Medicaid Other | Admitting: Podiatry

## 2015-10-29 ENCOUNTER — Telehealth: Payer: Self-pay | Admitting: *Deleted

## 2015-10-29 NOTE — Telephone Encounter (Signed)
PATIENT CALLED IN A WANTED MORE MEDICATION FOR EITHER PAIN OR FOR HER UTI SHE STATES THAT SHE HAS NOR HAD ANY RELIEF, PLEASE ADVISE.Marland KitchenMarland KitchenMarland Kitchen

## 2015-11-04 ENCOUNTER — Ambulatory Visit (INDEPENDENT_AMBULATORY_CARE_PROVIDER_SITE_OTHER): Payer: Medicaid Other

## 2015-11-04 ENCOUNTER — Ambulatory Visit (INDEPENDENT_AMBULATORY_CARE_PROVIDER_SITE_OTHER): Payer: Medicaid Other | Admitting: Podiatry

## 2015-11-04 ENCOUNTER — Encounter: Payer: Self-pay | Admitting: Podiatry

## 2015-11-04 ENCOUNTER — Other Ambulatory Visit: Payer: Medicaid Other

## 2015-11-04 ENCOUNTER — Other Ambulatory Visit (INDEPENDENT_AMBULATORY_CARE_PROVIDER_SITE_OTHER): Payer: Medicaid Other

## 2015-11-04 VITALS — BP 99/74 | HR 84 | Temp 98.3°F | Wt 192.0 lb

## 2015-11-04 VITALS — BP 103/70 | HR 74

## 2015-11-04 DIAGNOSIS — M2042 Other hammer toe(s) (acquired), left foot: Secondary | ICD-10-CM

## 2015-11-04 DIAGNOSIS — B373 Candidiasis of vulva and vagina: Secondary | ICD-10-CM

## 2015-11-04 DIAGNOSIS — M2041 Other hammer toe(s) (acquired), right foot: Secondary | ICD-10-CM

## 2015-11-04 DIAGNOSIS — B379 Candidiasis, unspecified: Secondary | ICD-10-CM

## 2015-11-04 DIAGNOSIS — N390 Urinary tract infection, site not specified: Secondary | ICD-10-CM

## 2015-11-04 DIAGNOSIS — M21619 Bunion of unspecified foot: Secondary | ICD-10-CM

## 2015-11-04 DIAGNOSIS — L84 Corns and callosities: Secondary | ICD-10-CM | POA: Diagnosis not present

## 2015-11-04 DIAGNOSIS — R3989 Other symptoms and signs involving the genitourinary system: Secondary | ICD-10-CM

## 2015-11-04 LAB — POCT URINALYSIS DIPSTICK
Bilirubin, UA: NEGATIVE
Glucose, UA: NEGATIVE
Ketones, UA: NEGATIVE
Leukocytes, UA: NEGATIVE
NITRITE UA: NEGATIVE
PH UA: 5
PROTEIN UA: NEGATIVE
RBC UA: NEGATIVE
SPEC GRAV UA: 1.02
UROBILINOGEN UA: 0.2

## 2015-11-04 MED ORDER — FLUCONAZOLE 150 MG PO TABS: 150.0000 mg | ORAL_TABLET | Freq: Once | ORAL | 0 refills | Status: AC

## 2015-11-04 MED ORDER — PHENAZOPYRIDINE HCL 200 MG PO TABS: 200.0000 mg | ORAL_TABLET | Freq: Three times a day (TID) | ORAL | 0 refills | Status: AC

## 2015-11-04 NOTE — Progress Notes (Signed)
Subjective:     Patient ID: Julie Valentine, female   DOB: 08/30/1970, 45 y.o.   MRN: [id number]  HPI patient presents stating I have a lot of thick tissue under my feet and I have nail disease and I have bunions and hammertoes that at times become painful   Review of Systems  All other systems reviewed and are negative.      Objective:   Physical Exam  Constitutional: She is oriented to person, place, and time.  Cardiovascular: Intact distal pulses.   Musculoskeletal: Normal range of motion.  Neurological: She is oriented to person, place, and time.  Skin: Skin is warm and dry.  Nursing note and vitals reviewed.  neurovascular status found to be intact muscle strength adequate with patient noted to have painful lesions under the third metatarsal right under the left foot and around the digits. 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

## 2015-11-04 NOTE — Progress Notes (Signed)
Patient in office for possible uti. Patient was previously treated with antibiotics but states that she is having pain in lower abdomen. Patient reported h/o of fibroids and pain could be r/t to that. Urine dipstick showed normal results. Patient will follow up with appt with provider.

## 2015-11-04 NOTE — Progress Notes (Signed)
   Subjective:    Patient ID: Julie Valentine, female    DOB: Jul 22, 1970, 45 y.o.   MRN: [id number]  HPI    Review of Systems  Genitourinary: Positive for urgency.  All other systems reviewed and are negative.      Objective:   Physical Exam        Assessment & Plan:

## 2015-11-05 ENCOUNTER — Ambulatory Visit: Payer: Medicaid Other | Admitting: Obstetrics

## 2015-11-05 ENCOUNTER — Ambulatory Visit: Payer: Medicaid Other | Admitting: Obstetrics and Gynecology

## 2015-11-05 ENCOUNTER — Encounter: Payer: Self-pay | Admitting: Podiatry

## 2015-11-06 LAB — URINE CULTURE

## 2015-11-09 ENCOUNTER — Other Ambulatory Visit: Payer: Self-pay | Admitting: Obstetrics and Gynecology

## 2015-11-09 ENCOUNTER — Ambulatory Visit (INDEPENDENT_AMBULATORY_CARE_PROVIDER_SITE_OTHER): Payer: Medicaid Other | Admitting: Obstetrics and Gynecology

## 2015-11-09 VITALS — BP 105/81 | HR 87 | Temp 98.8°F

## 2015-11-09 DIAGNOSIS — Z1231 Encounter for screening mammogram for malignant neoplasm of breast: Secondary | ICD-10-CM

## 2015-11-09 DIAGNOSIS — R102 Pelvic and perineal pain: Secondary | ICD-10-CM | POA: Diagnosis not present

## 2015-11-09 MED ORDER — CEPHALEXIN 500 MG PO CAPS: 500.0000 mg | ORAL_CAPSULE | Freq: Three times a day (TID) | ORAL | 0 refills | Status: DC

## 2015-11-09 MED ORDER — FLUCONAZOLE 150 MG PO TABS: 150.0000 mg | ORAL_TABLET | Freq: Once | ORAL | 0 refills | Status: AC

## 2015-11-09 MED ORDER — PHENAZOPYRIDINE HCL 200 MG PO TABS: 200.0000 mg | ORAL_TABLET | Freq: Three times a day (TID) | ORAL | 0 refills | Status: DC | PRN

## 2015-11-09 NOTE — Addendum Note (Signed)
Addended by: Mora Bellman on: 11/09/2015 03:44 PM   Modules accepted: Orders

## 2015-11-09 NOTE — Progress Notes (Signed)
45 yo G4P4 presenting as a follow up on lower pelvic pain. Patient was recently treated for a UTI but antibiotic used was not appropriate. She reports some Fidela Cieslak cramping pain in lower pelvis with some occasional burning on urination. She reports some lower abdominal pressure as well. Rest seems to alleviate her symptoms. She has been amenorrheic for the past 4 months.   Past Medical History:  Diagnosis Date  . Migraines    Past Surgical History:  Procedure Laterality Date  . LIVER SURGERY    . TUBAL LIGATION     Family History  Problem Relation Age of Onset  . Hypertension Father    Social History  Substance Use Topics  . Smoking status: Current Every Day Smoker    Packs/day: 0.50  . Smokeless tobacco: Never Used  . Alcohol use No     Comment: Occas., social, special occas.   ROS See pertinent in HPI  Blood pressure 105/81, pulse 87, temperature 98.8 F (37.1 C), temperature source Oral. GENERAL: Well-developed, well-nourished female in no acute distress.  ABDOMEN: Soft, nontender, nondistended. No organomegaly.  PELVIC: Normal external female genitalia. Vagina is pink and rugated.  Normal discharge. Normal appearing cervix. Uterus is normal in size. No adnexal mass or tenderness. EXTREMITIES: No cyanosis, clubbing, or edema, 2+ distal pulses.  A/P 45 yo with lower pelvic pain - Rx for UTI provided along with pyridium - Pelvic ultrasound ordered to rule out symptomatic fibroid uterus - Advised patient to keep a menstrual calendar - Breast mammogram ordered - Patient will be contacted with any abnormal results - RTC prn

## 2015-11-10 ENCOUNTER — Ambulatory Visit: Payer: Medicaid Other

## 2015-11-11 ENCOUNTER — Telehealth: Payer: Self-pay | Admitting: *Deleted

## 2015-11-11 ENCOUNTER — Other Ambulatory Visit: Payer: Self-pay | Admitting: Obstetrics and Gynecology

## 2015-11-11 MED ORDER — ONDANSETRON 4 MG PO TBDP: 4.0000 mg | ORAL_TABLET | Freq: Four times a day (QID) | ORAL | 0 refills | Status: DC | PRN

## 2015-11-11 MED ORDER — FAMOTIDINE 20 MG PO TABS: 20.0000 mg | ORAL_TABLET | Freq: Two times a day (BID) | ORAL | 3 refills | Status: DC

## 2015-11-11 NOTE — Telephone Encounter (Signed)
An Rx for Zofran has been prescribed. I also e-prescribed pepcid for heartburn  Thanks  [employer]

## 2015-11-11 NOTE — Telephone Encounter (Signed)
Patient states that the new antibiotic is making her nauseous. This happens to her a lot- she wants to know if the provider can send in something for nausea so she can finish her treatment. Told her I would ask.

## 2015-11-12 NOTE — Telephone Encounter (Signed)
Patient not accepting calls at this time.

## 2015-11-12 NOTE — Telephone Encounter (Signed)
Patient notified

## 2015-11-17 ENCOUNTER — Ambulatory Visit (HOSPITAL_COMMUNITY)
Admission: RE | Admit: 2015-11-17 | Discharge: 2015-11-17 | Disposition: A | Discharge status: Home / Self Care | Payer: Medicaid Other | Source: Ambulatory Visit | Attending: Obstetrics and Gynecology | Admitting: Obstetrics and Gynecology

## 2015-11-17 DIAGNOSIS — D259 Leiomyoma of uterus, unspecified: Secondary | ICD-10-CM | POA: Diagnosis not present

## 2015-11-17 DIAGNOSIS — R102 Pelvic and perineal pain: Secondary | ICD-10-CM | POA: Diagnosis not present

## 2015-11-17 DIAGNOSIS — N854 Malposition of uterus: Secondary | ICD-10-CM | POA: Insufficient documentation

## 2015-11-19 ENCOUNTER — Ambulatory Visit: Payer: Medicaid Other | Admitting: Obstetrics and Gynecology

## 2015-11-25 ENCOUNTER — Telehealth: Payer: Self-pay | Admitting: *Deleted

## 2015-11-25 NOTE — Telephone Encounter (Signed)
-----   Message from Mora Bellman, MD sent at 11/23/2015 12:52 PM EDT ----- Please inform the patient of a normal size uterus with a few fibroids. The largest one measuring 3 cm. The size of her uterus and fibroid is not responsible for her cramping pain. No intervention is needed for her fibroids either at this time  Thanks  Peggy

## 2015-11-25 NOTE — Telephone Encounter (Signed)
Patient is calling with pain. She is using too much Ibuprofen- because she is trying to manage her pain. She is also having hot flashes that are interfering with her sleep patterns. Patient has a follow up appointment and she feels she can't wait. Discussed her [place] results and her increasing pain. Discussed that if gyn reasons for her pain are ruled out - she may need to be referred to another provider-ie: GI, etc. To rule out other problems. She understands. Will try to move her appointment up and call her back.

## 2015-11-25 NOTE — Telephone Encounter (Signed)
Notified Ms. Furse that we would call her as soon as we got a cancellation. If her symptoms get worse go to ED

## 2015-12-04 ENCOUNTER — Other Ambulatory Visit: Payer: Self-pay | Admitting: Obstetrics

## 2015-12-04 DIAGNOSIS — K5909 Other constipation: Secondary | ICD-10-CM

## 2015-12-04 DIAGNOSIS — R1030 Lower abdominal pain, unspecified: Secondary | ICD-10-CM

## 2015-12-09 ENCOUNTER — Other Ambulatory Visit: Payer: Self-pay | Admitting: Gastroenterology

## 2015-12-09 DIAGNOSIS — R1084 Generalized abdominal pain: Secondary | ICD-10-CM

## 2015-12-11 ENCOUNTER — Encounter: Payer: Self-pay | Admitting: Radiology

## 2015-12-11 ENCOUNTER — Ambulatory Visit
Admission: RE | Admit: 2015-12-11 | Discharge: 2015-12-11 | Disposition: A | Discharge status: Home / Self Care | Payer: Medicaid Other | Source: Ambulatory Visit | Attending: Gastroenterology | Admitting: Gastroenterology

## 2015-12-11 DIAGNOSIS — R1084 Generalized abdominal pain: Secondary | ICD-10-CM

## 2015-12-11 MED ORDER — IOPAMIDOL (ISOVUE-300) INJECTION 61%
100.0000 mL | Freq: Once | INTRAVENOUS | Status: AC | PRN
  Administered 2015-12-11: 100 mL via INTRAVENOUS

## 2016-01-01 ENCOUNTER — Other Ambulatory Visit: Payer: Self-pay | Admitting: Gastroenterology

## 2016-01-06 ENCOUNTER — Encounter (HOSPITAL_COMMUNITY): Payer: Self-pay | Admitting: *Deleted

## 2016-01-06 NOTE — Progress Notes (Signed)
Pt denies SOB, chest pain, and being under the care of a cardiologist. Pt stated that she must pick up her bowel prep tomorrow morning. Pt made aware to stop taking  Aspirin, vitamins, fish oil and herbal medications. Do not take any NSAIDs ie: Ibuprofen, Advil, Naproxen, BC and Goody powder or any medication containing Aspirin. Pt verbalized understanding of all pre-op instructions.

## 2016-01-08 ENCOUNTER — Ambulatory Visit (HOSPITAL_COMMUNITY)
Admission: RE | Admit: 2016-01-08 | Discharge: 2016-01-08 | Disposition: A | Discharge status: Home / Self Care | Payer: Medicaid Other | Source: Ambulatory Visit | Attending: Gastroenterology | Admitting: Gastroenterology

## 2016-01-08 ENCOUNTER — Ambulatory Visit (HOSPITAL_COMMUNITY): Payer: Medicaid Other | Admitting: Anesthesiology

## 2016-01-08 ENCOUNTER — Encounter (HOSPITAL_COMMUNITY)
Admission: RE | Disposition: A | Discharge status: Home / Self Care | Payer: Self-pay | Source: Ambulatory Visit | Attending: Gastroenterology

## 2016-01-08 ENCOUNTER — Encounter (HOSPITAL_COMMUNITY): Payer: Self-pay | Admitting: *Deleted

## 2016-01-08 DIAGNOSIS — F17200 Nicotine dependence, unspecified, uncomplicated: Secondary | ICD-10-CM | POA: Insufficient documentation

## 2016-01-08 DIAGNOSIS — R51 Headache: Secondary | ICD-10-CM | POA: Insufficient documentation

## 2016-01-08 DIAGNOSIS — M797 Fibromyalgia: Secondary | ICD-10-CM | POA: Diagnosis not present

## 2016-01-08 DIAGNOSIS — K5730 Diverticulosis of large intestine without perforation or abscess without bleeding: Secondary | ICD-10-CM | POA: Diagnosis not present

## 2016-01-08 DIAGNOSIS — Z79899 Other long term (current) drug therapy: Secondary | ICD-10-CM | POA: Diagnosis not present

## 2016-01-08 DIAGNOSIS — R1084 Generalized abdominal pain: Secondary | ICD-10-CM | POA: Diagnosis present

## 2016-01-08 DIAGNOSIS — K219 Gastro-esophageal reflux disease without esophagitis: Secondary | ICD-10-CM | POA: Diagnosis present

## 2016-01-08 DIAGNOSIS — K640 First degree hemorrhoids: Secondary | ICD-10-CM | POA: Diagnosis not present

## 2016-01-08 DIAGNOSIS — R112 Nausea with vomiting, unspecified: Secondary | ICD-10-CM | POA: Diagnosis present

## 2016-01-08 HISTORY — DX: Gastro-esophageal reflux disease without esophagitis: K21.9

## 2016-01-08 HISTORY — PX: COLONOSCOPY WITH PROPOFOL: SHX5780

## 2016-01-08 HISTORY — DX: Fibromyalgia: M79.7

## 2016-01-08 HISTORY — DX: Unspecified abdominal pain: R10.9

## 2016-01-08 HISTORY — DX: Cardiac murmur, unspecified: R01.1

## 2016-01-08 SURGERY — COLONOSCOPY WITH PROPOFOL
Anesthesia: Monitor Anesthesia Care

## 2016-01-08 MED ORDER — SODIUM CHLORIDE 0.9 % IV SOLN: INTRAVENOUS | Status: DC

## 2016-01-08 MED ORDER — LACTATED RINGERS IV SOLN
INTRAVENOUS | Status: DC
  Administered 2016-01-08: 08:00:00 via INTRAVENOUS

## 2016-01-08 MED ORDER — PROPOFOL 500 MG/50ML IV EMUL
INTRAVENOUS | Status: DC | PRN
  Administered 2016-01-08: 125 ug/kg/min via INTRAVENOUS

## 2016-01-08 MED ORDER — LIDOCAINE HCL (CARDIAC) 20 MG/ML IV SOLN
INTRAVENOUS | Status: DC | PRN
  Administered 2016-01-08: 80 mg via INTRATRACHEAL

## 2016-01-08 MED ORDER — PROPOFOL 10 MG/ML IV BOLUS
INTRAVENOUS | Status: DC | PRN
  Administered 2016-01-08: 30 mg via INTRAVENOUS

## 2016-01-08 NOTE — Op Note (Addendum)
[facility] Patient Name: Julie Valentine Procedure Date : 01/08/2016 MRN: [id number] Attending MD: Lear Ng , MD Date of Birth: 30-Jan-1970 CSN: [id number] Age: 45 Admit Type: Outpatient Procedure:                Colonoscopy Indications:              This is the patient's first colonoscopy,                            Generalized abdominal pain Providers:                Lear Ng, MD, Kingsley Plan, RN, Cherylynn Ridges, Technician, Virgilio Belling. Beckner, CRNA Referring MD:              Medicines:                Propofol per Anesthesia, Monitored Anesthesia Care Complications:            No immediate complications. Estimated Blood Loss:     Estimated blood loss: none. Procedure:                Pre-Anesthesia Assessment:                           - Prior to the procedure, a History and Physical                            was performed, and patient medications and                            allergies were reviewed. The patient's tolerance of                            previous anesthesia was also reviewed. The risks                            and benefits of the procedure and the sedation                            options and risks were discussed with the patient.                            All questions were answered, and informed consent                            was obtained. Prior Anticoagulants: The patient has                            taken no previous anticoagulant or antiplatelet                            agents. ASA Grade Assessment: II - A patient with  mild systemic disease. After reviewing the risks                            and benefits, the patient was deemed in                            satisfactory condition to undergo the procedure.                           After obtaining informed consent, the colonoscope                            was passed under direct vision. Throughout the                          procedure, the patient's blood pressure, pulse, and                            oxygen saturations were monitored continuously. The                            [id number] scope was introduced through                            the anus and advanced to the the cecum, identified                            by appendiceal orifice and ileocecal valve. The                            colonoscopy was performed with difficulty due to                            poor bowel prep with stool present, significant                            looping and a tortuous colon. Successful completion                            of the procedure was aided by straightening and                            shortening the scope to obtain bowel loop                            reduction, applying abdominal pressure and lavage.                            The patient tolerated the procedure well. The                            quality of the bowel preparation was poor. The  ileocecal valve, the appendiceal orifice and the                            rectum were photographed. Scope In: 8:45:53 AM Scope Out: 9:00:57 AM Scope Withdrawal Time: 0 hours 8 minutes 21 seconds  Total Procedure Duration: 0 hours 15 minutes 4 seconds  Findings:      The perianal and digital rectal examinations were normal.      A few small-mouthed diverticula were found in the ascending colon.      Internal hemorrhoids were found during retroflexion. The hemorrhoids       were small and Grade I (internal hemorrhoids that do not prolapse).      Copious quantities of semi-solid stool was found in the entire colon,       precluding visualization. Impression:               - Preparation of the colon was poor.                           - Diverticulosis in the ascending colon.                           - Internal hemorrhoids.                           - Stool in the entire examined colon. No large                             lesions seen and no obstruction seen. No source of                            the abdominal pain seen. Prep was too poor to                            screen the colon and unable to visualize the cecal                            base due to poor prep.                           - No specimens collected. Moderate Sedation:      N/A - MAC procedure Recommendation:           - Repeat colonoscopy in 4 months because the bowel                            preparation was poor.                           - Continue present medications.                           - Resume previous diet.                           - Patient has a contact number available for  emergencies. The signs and symptoms of potential                            delayed complications were discussed with the                            patient. Return to normal activities tomorrow.                            Written discharge instructions were provided to the                            patient.                           - Refer to a surgeon to evaluate for suspected                            adhesions and lysis of them, which I think is the                            source of her abdominal pain (mainly RUQ). Procedure Code(s):        --- Professional ---                           [phone], Colonoscopy, flexible; diagnostic, including                            collection of specimen(s) by brushing or washing,                            when performed (separate procedure) Diagnosis Code(s):        --- Professional ---                           R10.84, Generalized abdominal pain                           K57.30, Diverticulosis of large intestine without                            perforation or abscess without bleeding                           K64.0, First degree hemorrhoids CPT copyright 2016 American [employer] Association. All rights reserved. The codes documented in this report are  preliminary and upon coder review may  be revised to meet current compliance requirements. Lear Ng, MD 01/08/2016 9:10:22 AM This report has been signed electronically. Number of Addenda: 0

## 2016-01-08 NOTE — H&P (Signed)
Date of Initial H&P: 12/09/15  History reviewed, patient examined, no change in status, stable for surgery.

## 2016-01-08 NOTE — Transfer of Care (Signed)
Immediate Anesthesia Transfer of Care Note  Patient: Julie Valentine  Procedure(s) Performed: Procedure(s): COLONOSCOPY WITH PROPOFOL (N/A)  Patient Location: Endoscopy Unit  Anesthesia Type:MAC  Level of Consciousness: awake, alert  and oriented  Airway & Oxygen Therapy: Patient Spontanous Breathing and Patient connected to face mask oxygen  Post-op Assessment: Report given to RN, Post -op Vital signs reviewed and stable and Patient moving all extremities X 4  Post vital signs: Reviewed and stable  Last Vitals:  Vitals:   01/08/16 0730  BP: 104/74  Pulse: 84  Resp: 17  Temp: 36.8 C    Last Pain:  Vitals:   01/08/16 0730  PainSc: 6       Patients Stated Pain Goal: 6 ([id number])  Complications: No apparent anesthesia complications

## 2016-01-08 NOTE — Anesthesia Postprocedure Evaluation (Signed)
Anesthesia Post Note  Patient: Julie Valentine  Procedure(s) Performed: Procedure(s) (LRB): COLONOSCOPY WITH PROPOFOL (N/A)  Patient location during evaluation: PACU Anesthesia Type: MAC Level of consciousness: awake and alert Pain management: pain level controlled Vital Signs Assessment: post-procedure vital signs reviewed and stable Respiratory status: spontaneous breathing, nonlabored ventilation, respiratory function stable and patient connected to nasal cannula oxygen Cardiovascular status: stable and blood pressure returned to baseline Anesthetic complications: no    Last Vitals:  Vitals:   01/08/16 0930 01/08/16 0935  BP: 96/66 98/68  Pulse: 72 72  Resp: 16 19  Temp:      Last Pain:  Vitals:   01/08/16 0906  TempSrc: Oral  PainSc:                  Effie Berkshire

## 2016-01-08 NOTE — Anesthesia Preprocedure Evaluation (Addendum)
Anesthesia Evaluation  Patient identified by MRN, date of birth, ID band Patient awake    Reviewed: Allergy & Precautions, NPO status , Patient's Chart, lab work & pertinent test results  Airway Mallampati: III  TM Distance: >3 FB Neck ROM: Full    Dental  (+) Teeth Intact, Dental Advisory Given   Pulmonary Current Smoker,    breath sounds clear to auscultation       Cardiovascular negative cardio ROS   Rhythm:Regular Rate:Normal     Neuro/Psych  Headaches, negative psych ROS   GI/Hepatic Neg liver ROS, GERD  Medicated,  Endo/Other  negative endocrine ROS  Renal/GU negative Renal ROS  negative genitourinary   Musculoskeletal  (+) Fibromyalgia -  Abdominal   Peds negative pediatric ROS (+)  Hematology negative hematology ROS (+)   Anesthesia Other Findings   Reproductive/Obstetrics negative OB ROS                            Anesthesia Physical Anesthesia Plan  ASA: II  Anesthesia Plan: MAC   Post-op Pain Management:    Induction: Intravenous  Airway Management Planned: Natural Airway  Additional Equipment:   Intra-op Plan:   Post-operative Plan:   Informed Consent: I have reviewed the patients History and Physical, chart, labs and discussed the procedure including the risks, benefits and alternatives for the proposed anesthesia with the patient or authorized representative who has indicated his/her understanding and acceptance.   Dental advisory given  Plan Discussed with: CRNA  Anesthesia Plan Comments:         Anesthesia Quick Evaluation

## 2016-01-08 NOTE — Interval H&P Note (Signed)
History and Physical Interval Note:  01/08/2016 8:36 AM  Julie Valentine  has presented today for surgery, with the diagnosis of abd. pain  The various methods of treatment have been discussed with the patient and family. After consideration of risks, benefits and other options for treatment, the patient has consented to  Procedure(s): COLONOSCOPY WITH PROPOFOL (N/A) as a surgical intervention .  The patient's history has been reviewed, patient examined, no change in status, stable for surgery.  I have reviewed the patient's chart and labs.  Questions were answered to the patient's satisfaction.     [place] C.

## 2016-01-08 NOTE — Discharge Instructions (Signed)
YOU HAD AN ENDOSCOPIC PROCEDURE TODAY: Refer to the procedure report and other information in the discharge instructions given to you for any specific questions about what was found during the examination. If this information does not answer your questions, please call [facility] office at [phone] to clarify.   YOU SHOULD EXPECT: Some feelings of bloating in the abdomen. Passage of more gas than usual. Walking can help get rid of the air that was put into your GI tract during the procedure and reduce the bloating. If you had a lower endoscopy (such as a colonoscopy or flexible sigmoidoscopy) you may notice spotting of blood in your stool or on the toilet paper. Some abdominal soreness may be present for a day or two, also.  DIET: Your first meal following the procedure should be a light meal and then it is ok to progress to your normal diet. A half-sandwich or bowl of soup is an example of a good first meal. Heavy or fried foods are harder to digest and may make you feel nauseous or bloated. Drink plenty of fluids but you should avoid alcoholic beverages for 24 hours. If you had a esophageal dilation, please see attached instructions for diet.   ACTIVITY: Your care partner should take you home directly after the procedure. You should plan to take it easy, moving slowly for the rest of the day. You can resume normal activity the day after the procedure however YOU SHOULD NOT DRIVE, use power tools, machinery or perform tasks that involve climbing or major physical exertion for 24 hours (because of the sedation medicines used during the test).   SYMPTOMS TO REPORT IMMEDIATELY: A gastroenterologist can be reached at any hour. Please call [phone]  for any of the following symptoms:  Following lower endoscopy (colonoscopy, flexible sigmoidoscopy) Excessive amounts of blood in the stool  Significant tenderness, worsening of abdominal pains  Swelling of the abdomen that is new, acute  Fever of 100 or  higher  Black, tarry-looking or red, bloody stools  FOLLOW UP:  If any biopsies were taken you will be contacted by phone or by letter within the next 1-3 weeks. Call [phone]  if you have not heard about the biopsies in 3 weeks.  Please also call with any specific questions about appointments or follow up tests.

## 2016-01-11 ENCOUNTER — Encounter (HOSPITAL_COMMUNITY): Payer: Self-pay | Admitting: Gastroenterology

## 2016-01-13 ENCOUNTER — Ambulatory Visit: Payer: Medicaid Other

## 2016-06-23 DIAGNOSIS — Z01419 Encounter for gynecological examination (general) (routine) without abnormal findings: Secondary | ICD-10-CM | POA: Diagnosis not present

## 2016-06-23 DIAGNOSIS — Z114 Encounter for screening for human immunodeficiency virus [HIV]: Secondary | ICD-10-CM | POA: Diagnosis not present

## 2016-06-23 DIAGNOSIS — Z0000 Encounter for general adult medical examination without abnormal findings: Secondary | ICD-10-CM | POA: Diagnosis not present

## 2016-06-23 DIAGNOSIS — Z130 Encounter for screening for diseases of the blood and blood-forming organs and certain disorders involving the immune mechanism: Secondary | ICD-10-CM | POA: Diagnosis not present

## 2016-06-23 DIAGNOSIS — Z113 Encounter for screening for infections with a predominantly sexual mode of transmission: Secondary | ICD-10-CM | POA: Diagnosis not present

## 2016-06-23 DIAGNOSIS — Z6832 Body mass index (BMI) 32.0-32.9, adult: Secondary | ICD-10-CM | POA: Diagnosis not present

## 2016-06-23 DIAGNOSIS — Z1159 Encounter for screening for other viral diseases: Secondary | ICD-10-CM | POA: Diagnosis not present

## 2016-06-23 DIAGNOSIS — Z1329 Encounter for screening for other suspected endocrine disorder: Secondary | ICD-10-CM | POA: Diagnosis not present

## 2016-06-23 DIAGNOSIS — N911 Secondary amenorrhea: Secondary | ICD-10-CM | POA: Diagnosis not present

## 2016-06-23 DIAGNOSIS — Z3200 Encounter for pregnancy test, result unknown: Secondary | ICD-10-CM | POA: Diagnosis not present

## 2016-06-23 DIAGNOSIS — Z1231 Encounter for screening mammogram for malignant neoplasm of breast: Secondary | ICD-10-CM | POA: Diagnosis not present

## 2016-06-23 DIAGNOSIS — N760 Acute vaginitis: Secondary | ICD-10-CM | POA: Diagnosis not present

## 2016-06-23 DIAGNOSIS — Z3149 Encounter for other procreative investigation and testing: Secondary | ICD-10-CM | POA: Diagnosis not present

## 2016-07-18 ENCOUNTER — Other Ambulatory Visit: Payer: Self-pay | Admitting: Gastroenterology

## 2016-09-22 ENCOUNTER — Encounter (HOSPITAL_COMMUNITY): Admission: RE | Payer: Self-pay | Source: Ambulatory Visit

## 2016-09-22 ENCOUNTER — Ambulatory Visit (HOSPITAL_COMMUNITY)
Admission: RE | Admit: 2016-09-22 | Payer: BLUE CROSS/BLUE SHIELD | Source: Ambulatory Visit | Admitting: Gastroenterology

## 2016-09-22 SURGERY — COLONOSCOPY WITH PROPOFOL
Anesthesia: Monitor Anesthesia Care

## 2016-10-25 ENCOUNTER — Emergency Department (HOSPITAL_COMMUNITY): Payer: BLUE CROSS/BLUE SHIELD

## 2016-10-25 ENCOUNTER — Encounter (HOSPITAL_COMMUNITY): Payer: Self-pay | Admitting: Emergency Medicine

## 2016-10-25 DIAGNOSIS — R079 Chest pain, unspecified: Secondary | ICD-10-CM | POA: Diagnosis not present

## 2016-10-25 DIAGNOSIS — R0602 Shortness of breath: Secondary | ICD-10-CM | POA: Diagnosis not present

## 2016-10-25 DIAGNOSIS — F17210 Nicotine dependence, cigarettes, uncomplicated: Secondary | ICD-10-CM | POA: Diagnosis not present

## 2016-10-25 DIAGNOSIS — Z79899 Other long term (current) drug therapy: Secondary | ICD-10-CM | POA: Insufficient documentation

## 2016-10-25 DIAGNOSIS — R1011 Right upper quadrant pain: Secondary | ICD-10-CM | POA: Insufficient documentation

## 2016-10-25 DIAGNOSIS — K219 Gastro-esophageal reflux disease without esophagitis: Secondary | ICD-10-CM | POA: Diagnosis not present

## 2016-10-25 DIAGNOSIS — R0789 Other chest pain: Secondary | ICD-10-CM | POA: Insufficient documentation

## 2016-10-25 LAB — BASIC METABOLIC PANEL
ANION GAP: 8 (ref 5–15)
BUN: 11 mg/dL (ref 6–20)
CALCIUM: 9.2 mg/dL (ref 8.9–10.3)
CO2: 26 mmol/L (ref 22–32)
CREATININE: 0.91 mg/dL (ref 0.44–1.00)
Chloride: 105 mmol/L (ref 101–111)
Glucose, Bld: 89 mg/dL (ref 65–99)
Potassium: 3.8 mmol/L (ref 3.5–5.1)
SODIUM: 139 mmol/L (ref 135–145)

## 2016-10-25 LAB — CBC
HCT: 37.8 % (ref 36.0–46.0)
Hemoglobin: 13.2 g/dL (ref 12.0–15.0)
MCH: 33.3 pg (ref 26.0–34.0)
MCHC: 34.9 g/dL (ref 30.0–36.0)
MCV: 95.5 fL (ref 78.0–100.0)
PLATELETS: 282 10*3/uL (ref 150–400)
RBC: 3.96 MIL/uL (ref 3.87–5.11)
RDW: 13.7 % (ref 11.5–15.5)
WBC: 5.3 10*3/uL (ref 4.0–10.5)

## 2016-10-25 LAB — POCT I-STAT TROPONIN I: TROPONIN I, POC: 0 ng/mL (ref 0.00–0.08)

## 2016-10-25 NOTE — ED Triage Notes (Signed)
Patient c/o centralized chest pain onset of 1 hour ago. Took 81mg  aspirin PTA. C/o shortness of breath with minor nausea. Speaking in full sentences.

## 2016-10-26 ENCOUNTER — Emergency Department (HOSPITAL_COMMUNITY)
Admission: EM | Admit: 2016-10-26 | Discharge: 2016-10-26 | Disposition: A | Discharge status: Home / Self Care | Payer: BLUE CROSS/BLUE SHIELD | Attending: Emergency Medicine | Admitting: Emergency Medicine

## 2016-10-26 DIAGNOSIS — K219 Gastro-esophageal reflux disease without esophagitis: Secondary | ICD-10-CM

## 2016-10-26 DIAGNOSIS — R079 Chest pain, unspecified: Secondary | ICD-10-CM

## 2016-10-26 LAB — HEPATIC FUNCTION PANEL
ALK PHOS: 83 U/L (ref 38–126)
ALT: 19 U/L (ref 14–54)
AST: 19 U/L (ref 15–41)
Albumin: 4 g/dL (ref 3.5–5.0)
Total Protein: 7.6 g/dL (ref 6.5–8.1)

## 2016-10-26 LAB — LIPASE, BLOOD: Lipase: 40 U/L (ref 11–51)

## 2016-10-26 LAB — I-STAT BETA HCG BLOOD, ED (NOT ORDERABLE): I-stat hCG, quantitative: <5 m[IU]/mL (ref <5)

## 2016-10-26 LAB — D-DIMER, QUANTITATIVE (NOT AT ARMC)

## 2016-10-26 LAB — TROPONIN I: Troponin I: <0.03 ng/mL (ref <0.03)

## 2016-10-26 MED ORDER — OMEPRAZOLE 20 MG PO CPDR: 20.0000 mg | DELAYED_RELEASE_CAPSULE | Freq: Two times a day (BID) | ORAL | 0 refills | Status: AC

## 2016-10-26 MED ORDER — LORAZEPAM 2 MG/ML IJ SOLN
1.0000 mg | Freq: Once | INTRAMUSCULAR | Status: AC
  Administered 2016-10-26: 1 mg via INTRAVENOUS
  Filled 2016-10-26: qty 1

## 2016-10-26 MED ORDER — GI COCKTAIL ~~LOC~~
30.0000 mL | Freq: Once | ORAL | Status: AC
  Administered 2016-10-26: 30 mL via ORAL
  Filled 2016-10-26: qty 30

## 2016-10-26 MED ORDER — ONDANSETRON HCL 4 MG/2ML IJ SOLN
4.0000 mg | Freq: Once | INTRAMUSCULAR | Status: AC
  Administered 2016-10-26: 4 mg via INTRAVENOUS
  Filled 2016-10-26: qty 2

## 2016-10-26 MED ORDER — ACETAMINOPHEN 325 MG PO TABS
650.0000 mg | ORAL_TABLET | Freq: Once | ORAL | Status: AC
  Administered 2016-10-26: 650 mg via ORAL
  Filled 2016-10-26: qty 2

## 2016-10-26 MED ORDER — MORPHINE SULFATE (PF) 4 MG/ML IV SOLN
4.0000 mg | Freq: Once | INTRAVENOUS | Status: AC
  Administered 2016-10-26: 4 mg via INTRAVENOUS
  Filled 2016-10-26: qty 1

## 2016-10-26 MED ORDER — ONDANSETRON 4 MG PO TBDP: 4.0000 mg | ORAL_TABLET | Freq: Once | ORAL | Status: DC

## 2016-10-26 NOTE — ED Provider Notes (Signed)
[facility] DEPT Provider Note   CSN: [id number] Arrival date & time: 10/25/16  2022     History   Chief Complaint Chief Complaint  Patient presents with  . Chest Pain    HPI Julie Valentine is a 46 y.o. female with a h/o of GERD on prilosec who presents to the emergency department with a chief complaint of sudden onset central chest pain that radiates to her back and under her right breast with associated dyspnea, nausea, and diaphoresis. She reports that her chest pain is aggravated with lying flat and she feels more short of breathand alleviated with sitting up straight. 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 She treated her symptoms with an 81 mg aspirin and 2 Rolaids without improvement. No history of previous symptoms. She reports prior to the onset of her chest pain that she was feeling at her baseline.  She also complains of chronic right upper quadrant abdominal pain that has worsened over the last 2 months. She reports that she was previously evaluated by cardiology in 2010 prior to having a cholecystectomy. No cardiology follow-up since.  She denies recent travel, pain or swelling in her legs. No fever, chills, numbness, weakness, emesis, or diarrhea.  Family history includes her father who had an MI around age 80. Past medical history the patient includes chronic abdominal pain and GERD. Daily medications include Prilosec.  The history is provided by the patient. No language interpreter was used.    Past Medical History:  Diagnosis Date  . Abdominal pain   . Fibromyalgia   . GERD (gastroesophageal reflux disease)   . Heart murmur   . Migraines    migraines    Patient Active Problem List   Diagnosis Date Noted  . Abdominal pain, generalized 01/08/2016  . Nausea with vomiting 01/08/2016  . Esophageal reflux 01/08/2016    Past Surgical History:  Procedure Laterality Date  . CHOLECYSTECTOMY    .  COLONOSCOPY WITH PROPOFOL N/A 01/08/2016   Procedure: COLONOSCOPY WITH PROPOFOL;  Surgeon: Wilford Corner, MD;  Location: [facility] ENDOSCOPY;  Service: Endoscopy;  Laterality: N/A;  . LIVER SURGERY    . TUBAL LIGATION      OB History    Gravida Para Term Preterm AB Living   4 4 4  0 0 4   SAB TAB Ectopic Multiple Live Births   0 0 0 0         Home Medications    Prior to Admission medications   Medication Sig Start Date End Date Taking? Authorizing Provider  acetaminophen (TYLENOL) 500 MG tablet Take 1,000 mg by mouth every 6 (six) hours as needed for moderate pain.    [provider]  cephALEXin (KEFLEX) 500 MG capsule Take 1 capsule (500 mg total) by mouth 3 (three) times daily. 11/09/15   Chancy Milroy, MD  famotidine (PEPCID) 20 MG tablet Take 1 tablet (20 mg total) by mouth 2 (two) times daily. 11/11/15   Constant, Peggy, MD  nitrofurantoin, macrocrystal-monohydrate, (MACROBID) 100 MG capsule Take 1 capsule (100 mg total) by mouth 2 (two) times daily. Patient not taking: Reported on 01/08/2016 10/16/15   Shelly Bombard, MD  omeprazole (PRILOSEC) 20 MG capsule Take 1 capsule (20 mg total) by mouth 2 (two) times daily before a meal. 10/26/16   Memori Sammon A, PA-C  ondansetron (ZOFRAN ODT) 4 MG disintegrating tablet Take 1 tablet (4 mg total) by mouth  every 6 (six) hours as needed for nausea. 11/11/15   Constant, Peggy, MD  ondansetron (ZOFRAN) 4 MG tablet Take 1 tablet (4 mg total) by mouth every 6 (six) hours. Patient not taking: Reported on 01/08/2016 06/09/15   Virgel Manifold, MD  phenazopyridine (PYRIDIUM) 200 MG tablet Take 1 tablet (200 mg total) by mouth 3 (three) times daily as needed for pain. 11/09/15   Constant, Peggy, MD  traMADol (ULTRAM) 50 MG tablet Take 1 tablet (50 mg total) by mouth every 6 (six) hours as needed. 06/09/15   Virgel Manifold, MD    Family History Family History  Problem Relation Age of Onset  . Hypertension Father     Social  History Social History  Substance Use Topics  . Smoking status: Current Every Day Smoker    Packs/day: 0.50  . Smokeless tobacco: Never Used  . Alcohol use Yes     Comment: rare     Allergies   Patient has no known allergies.   Review of Systems Review of Systems  Constitutional: Positive for diaphoresis. Negative for activity change and fever.  Respiratory: Positive for shortness of breath. Negative for cough.   Cardiovascular: Positive for chest pain. Negative for palpitations and leg swelling.  Gastrointestinal: Positive for abdominal pain and nausea. Negative for diarrhea and vomiting.  Musculoskeletal: Positive for back pain.  Skin: Negative for rash.  Neurological: Negative for dizziness, syncope and light-headedness.     Physical Exam Updated Vital Signs BP 106/67   Pulse 73   Temp 98.3 F (36.8 C) (Oral)   Resp 16   SpO2 98%   Physical Exam  Constitutional: She appears well-developed and well-nourished. No distress.  HENT:  Head: Normocephalic.  Eyes: Conjunctivae are normal.  Neck: Neck supple. No JVD present. No tracheal deviation present.  Cardiovascular: Normal rate, regular rhythm and intact distal pulses.  Exam reveals no gallop and no friction rub.   Murmur heard. Pulmonary/Chest: Effort normal. No respiratory distress. She has no wheezes. She has no rales. She exhibits tenderness.  Abdominal: Soft. She exhibits no distension. There is tenderness.  Moderate tenderness to palpation over the right upper and lower quadrants. No rebound or guarding. No CVA tenderness bilaterally.  Musculoskeletal:  No reproducible tenderness to palpation of the cervical, thoracic, or lumbar spinous processes or surrounding paraspinal muscles.  Neurological: She is alert.  Skin: Skin is warm. No rash noted. She is not diaphoretic.  Psychiatric: Her behavior is normal.  Nursing note and vitals reviewed.    ED Treatments / Results  Labs (all labs ordered are listed,  but only abnormal results are displayed) Labs Reviewed  HEPATIC FUNCTION PANEL - Abnormal; Notable for the following:       Result Value   Total Bilirubin <0.1 (*)    Bilirubin, Direct <0.1 (*)    All other components within normal limits  BASIC METABOLIC PANEL  CBC  LIPASE, BLOOD  TROPONIN I  D-DIMER, QUANTITATIVE (NOT AT [facility])  I-STAT TROPONIN, ED  POCT I-STAT TROPONIN I  I-STAT BETA HCG BLOOD, ED ([facility], [facility], [facility] ONLY)  I-STAT BETA HCG BLOOD, ED (NOT ORDERABLE)    EKG  EKG Interpretation  Date/Time:  Tuesday October 25 2016 20:41:56 EDT Ventricular Rate:  80 PR Interval:    QRS Duration: 87 QT Interval:  388 QTC Calculation: 448 R Axis:   67 Text Interpretation:  Sinus rhythm ST elev, probable normal early repol pattern Otherwise within normal limits When compared with ECG of 05/11/2011, No significant  change was found Confirmed by Delora Fuel ([id number]) on 10/26/2016 3:49:48 AM Also confirmed by Delora Fuel ([id number]), editor Hattie Perch [phone])  on 10/26/2016 7:36:55 AM       Radiology Dg Chest 2 View  Result Date: 10/25/2016 CLINICAL DATA:  Chest pain with shortness of breath for 2 hours. History of heart murmur. Smoker. EXAM: CHEST  2 VIEW COMPARISON:  05/11/2011 FINDINGS: Normal heart size and pulmonary vascularity. No focal airspace disease or consolidation in the lungs. No blunting of costophrenic angles. No pneumothorax. Mediastinal contours appear intact. IMPRESSION: No active cardiopulmonary disease. Electronically Signed   By: Lucienne Capers M.D.   On: 10/25/2016 21:06    Procedures Procedures (including critical care time)  Medications Ordered in ED Medications  ondansetron (ZOFRAN) injection 4 mg (4 mg Intravenous Given 10/26/16 0157)  morphine 4 MG/ML injection 4 mg (4 mg Intravenous Given 10/26/16 0157)  acetaminophen (TYLENOL) tablet 650 mg (650 mg Oral Given 10/26/16 0303)  LORazepam (ATIVAN) injection 1 mg (1 mg Intravenous Given 10/26/16 0305)  gi  cocktail (Maalox,Lidocaine,Donnatal) (30 mLs Oral Given 10/26/16 0400)     Initial Impression / Assessment and Plan / ED Course  I have reviewed the triage vital signs and the nursing notes.  Pertinent labs & imaging results that were available during my care of the patient were reviewed by me and considered in my medical decision making (see chart for details).     89 showed female with a history of GERD on Prilosec and a h/o of a heart murmur presenting with chest pain, dyspnea, diaphoresis that began tonight while she was at work. No history of similar. EKG unchanged from previous. Chest x-ray does not demonstrate widening of the mediastinum, pneumothorax, pneumonia, or pulmonary congestion. Delta troponin negative. CBC and BMP are unremarkable. LFTs ordered due to patient's right-sided abdominal pain and history of resection of liver hemangioma, which are unremarkable. D-dimer is negative. Pregnancy test negative. The patient was discussed with Dr. Roxanne Mins, attending physician, who recommended doubling the patient's Prilosec over the next 2 weeks. The patient was given a GI cocktail in the emergency department. Discussed the plan with the patient has agreeable at this time. Referral to gastroenterology given. Strict return precautions given. No acute distress. The patient is safe for discharge at this time.  Final Clinical Impressions(s) / ED Diagnoses   Final diagnoses:  Chest pain due to GERD    New Prescriptions Discharge Medication List as of 10/26/2016  4:31 AM    START taking these medications   Details  omeprazole (PRILOSEC) 20 MG capsule Take 1 capsule (20 mg total) by mouth 2 (two) times daily before a meal., Starting Wed 10/26/2016, Print         Ladarion Munyon A, PA-C 97/41/63 [id number]    Delora Fuel, MD 64/68/03 [phone]

## 2016-10-26 NOTE — ED Notes (Signed)
Pt. On cardiac monitor. 

## 2016-10-26 NOTE — ED Notes (Signed)
Bed: BV69 Expected date:  Expected time:  Means of arrival:  Comments: Rm 2

## 2016-10-26 NOTE — Discharge Instructions (Signed)
Please take 2 Prilosec daily for the next 2 weeks. Your symptoms seem consistent with a severe flare-up of your GERD.   Your EKG, chest x-ray, and lab work did not indicate your symptoms were coming from your heart.   You can call [facility] to follow up if you symptoms persist.  If you develop new or worsening symptoms, including severe shortness of breath, new or worsening chest pain, or have a syncopal episode, lease return to the emergency department for re-evaluation.

## 2017-01-26 ENCOUNTER — Other Ambulatory Visit: Payer: Self-pay | Admitting: Podiatry

## 2017-01-26 ENCOUNTER — Ambulatory Visit (INDEPENDENT_AMBULATORY_CARE_PROVIDER_SITE_OTHER): Payer: BLUE CROSS/BLUE SHIELD

## 2017-01-26 ENCOUNTER — Encounter: Payer: Self-pay | Admitting: Podiatry

## 2017-01-26 ENCOUNTER — Ambulatory Visit: Payer: BLUE CROSS/BLUE SHIELD | Admitting: Podiatry

## 2017-01-26 DIAGNOSIS — M2041 Other hammer toe(s) (acquired), right foot: Secondary | ICD-10-CM

## 2017-01-26 DIAGNOSIS — M79672 Pain in left foot: Secondary | ICD-10-CM

## 2017-01-26 DIAGNOSIS — M2042 Other hammer toe(s) (acquired), left foot: Secondary | ICD-10-CM

## 2017-01-26 DIAGNOSIS — M21619 Bunion of unspecified foot: Secondary | ICD-10-CM | POA: Diagnosis not present

## 2017-01-26 DIAGNOSIS — M779 Enthesopathy, unspecified: Secondary | ICD-10-CM

## 2017-01-26 DIAGNOSIS — D169 Benign neoplasm of bone and articular cartilage, unspecified: Secondary | ICD-10-CM | POA: Diagnosis not present

## 2017-01-26 DIAGNOSIS — M79671 Pain in right foot: Secondary | ICD-10-CM | POA: Diagnosis not present

## 2017-01-26 DIAGNOSIS — M216X9 Other acquired deformities of unspecified foot: Secondary | ICD-10-CM

## 2017-01-26 MED ORDER — TRIAMCINOLONE ACETONIDE 10 MG/ML IJ SUSP
10.0000 mg | Freq: Once | INTRAMUSCULAR | Status: AC
  Administered 2017-01-26: 10 mg

## 2017-01-26 NOTE — Patient Instructions (Addendum)
Pre-Operative Instructions  Congratulations, you have decided to take an important step towards improving your quality of life.  You can be assured that the doctors and staff at [facility] will be with you every step of the way.  Here are some important things you should know:  1. Plan to be at the surgery center/hospital at least 1 (one) hour prior to your scheduled time, unless otherwise directed by the surgical center/hospital staff.  You must have a responsible adult accompany you, remain during the surgery and drive you home.  Make sure you have directions to the surgical center/hospital to ensure you arrive on time. 2. If you are having surgery at [facility] or [facility], you will need a copy of your medical history and physical form from your family physician within one month prior to the date of surgery. We will give you a form for your primary physician to complete.  3. We make every effort to accommodate the date you request for surgery.  However, there are times where surgery dates or times have to be moved.  We will contact you as soon as possible if a change in schedule is required.   4. No aspirin/ibuprofen for one week before surgery.  If you are on aspirin, any non-steroidal anti-inflammatory medications (Mobic, Aleve, Ibuprofen) should not be taken seven (7) days prior to your surgery.  You make take Tylenol for pain prior to surgery.  5. Medications - If you are taking daily heart and blood pressure medications, seizure, reflux, allergy, asthma, anxiety, pain or diabetes medications, make sure you notify the surgery center/hospital before the day of surgery so they can tell you which medications you should take or avoid the day of surgery. 6. No food or drink after midnight the night before surgery unless directed otherwise by surgical center/hospital staff. 7. No alcoholic beverages 24-hours prior to surgery.  No smoking 24-hours prior or 24-hours after  surgery. 8. Wear loose pants or shorts. They should be loose enough to fit over bandages, boots, and casts. 9. Don't wear slip-on shoes. Sneakers are preferred. 10. Bring your boot with you to the surgery center/hospital.  Also bring crutches or a walker if your physician has prescribed it for you.  If you do not have this equipment, it will be provided for you after surgery. 11. If you have not been contacted by the surgery center/hospital by the day before your surgery, call to confirm the date and time of your surgery. 12. Leave-time from work may vary depending on the type of surgery you have.  Appropriate arrangements should be made prior to surgery with your employer. 13. Prescriptions will be provided immediately following surgery by your doctor.  Fill these as soon as possible after surgery and take the medication as directed. Pain medications will not be refilled on weekends and must be approved by the doctor. 14. Remove nail polish on the operative foot and avoid getting pedicures prior to surgery. 15. Wash the night before surgery.  The night before surgery wash the foot and leg well with water and the antibacterial soap provided. Be sure to pay special attention to beneath the toenails and in between the toes.  Wash for at least three (3) minutes. Rinse thoroughly with water and dry well with a towel.  Perform this wash unless told not to do so by your physician.  Enclosed: 1 Ice pack (please put in freezer the night before surgery)   1 Hibiclens skin cleaner     Pre-op instructions  If you have any questions regarding the instructions, please do not hesitate to call our office.  [place]: [address], [place], [place] [address] -- [phone]  [place]: [address] [place], [place] [address] -- [phone]  [place]: [address]  [place], [place] [address] -- [phone]  [place]: [address], [address], [place], [place] [address] -- [phone]  Website:  [email or web address] 

## 2017-01-26 NOTE — Progress Notes (Signed)
Subjective:   Patient ID: Julie Valentine, female   DOB: 47 y.o.   MRN: [id number]   HPI Patient presents stating that her feet are increasingly hurting more and making it increasingly difficult to wear shoe gear.  States that the toes of the feet bilaterally are really hurting bunions bilateral lower hurting and she has terrible pain on the left midfoot that started just more recently.  Patient states she is known she is need surgery and wants to go ahead and get this corrected   ROS      Objective:  Physical Exam  Neurovascular status intact with patient noted to have severe keratotic lesions second and third digits bilateral fourth digit left distal second digit right structural bunion deformity bilateral with deviation of the big toe against the second toe bilateral with lesion formation and lesion formation fifth digit bilateral.  On the right foot there is a sub-third keratotic lesion that is painful and on the left foot there is inflammation and pain around the fifth metatarsal base and lesion fifth metatarsal.     Assessment:  Significant structural deformity bilateral with patient that also is a significant callus former and after questioning states that she smokes 8-10 cigarettes/day.  Is noted to have tendinitis base of fifth metatarsal left and chronic callus formation again secondary to her foot structure and her skin structure     Plan:  H&P and all conditions and x-rays reviewed with patient.  Due to long-standing structural deformity she wants correction and I did explain to her the difficulty of her surgery given her skin type environment and the fact that calluses corns may not resolve with surgery.  She understands this and wants surgery and I allowed her to read a consent form going over alternative treatments and complications.  Patient is willing to accept risk and after extensive review signed consent form understanding total recovery will take 6 months to 1 year.   Dispensed air fracture walker and injected the base of the fifth metatarsal left 3 mg Kenalog 5 mg Xylocaine and debrided lesions  X-rays indicate structural elevation of the intermetatarsal angle bilateral with deviation of the hallux against second toe bilateral with digital deformities with elongation of the lesser digits bilateral with elongated third metatarsal right

## 2017-01-27 DIAGNOSIS — N914 Secondary oligomenorrhea: Secondary | ICD-10-CM | POA: Diagnosis not present

## 2017-01-27 DIAGNOSIS — N970 Female infertility associated with anovulation: Secondary | ICD-10-CM | POA: Diagnosis not present

## 2017-02-06 ENCOUNTER — Telehealth: Payer: Self-pay | Admitting: *Deleted

## 2017-02-06 NOTE — Telephone Encounter (Signed)
I told pt that the new skin under the callous was irritated by every thing touching it and to ice 3-4 times daily for 15 minutes/session and to cover with neosporin dressing and the symptoms should decrease. Pt states she is padding the area off and It helped some. I told her to continue with padding off.

## 2017-02-06 NOTE — Telephone Encounter (Signed)
"  I'm scheduled for surgery in February.  I'd like to know what my co-pay is going to be."  I can get Jocelyn Lamer in our insurance department to give you a call with an estimate.  "Okay, thank you."  (Aiken Osteotomy, Austin Bunionectomy, Metatarsal Osteotomy 3rd, Hammer Toe Repair 2,3 w/ pin and 5th, Exostectomy 2nd)

## 2017-02-06 NOTE — Telephone Encounter (Signed)
Pt states her left foot burns on the side where he shaved down.

## 2017-02-07 ENCOUNTER — Telehealth: Payer: Self-pay | Admitting: *Deleted

## 2017-02-07 NOTE — Telephone Encounter (Signed)
I called Julie Valentine to cancel the surgery for 03/07/2017.  She didn't have her on the books.  Patient has a $5000 deductible.

## 2017-02-07 NOTE — Telephone Encounter (Signed)
-----   Message from Karsten Ro sent at 02/06/2017  4:19 PM EST ----- Cancel surgery Feb. Once given estimate cannot afford. I will check with Dr Paulla Dolly to see if possible to do in office. She is also checking to see if can get a different insurance policy. Cancel date in Feb. I don't believe you gave me the date of surgery.

## 2017-02-10 NOTE — Telephone Encounter (Signed)
Have Julie Valentine find out if her deductible is waved if she does it in the office and then I will decide if it possible

## 2017-02-20 ENCOUNTER — Telehealth: Payer: Self-pay | Admitting: *Deleted

## 2017-02-20 NOTE — Telephone Encounter (Signed)
"  I want to reschedule my surgery.  I was scheduled for February 20 but I had problems with my insurance.  I got everything straightened out now.  So I want to reschedule it."  You were scheduled for February 12.  "I know I can't do it then.  Let me call you back.  I need to check my work schedule.  I will call you back in two minutes."

## 2017-02-22 ENCOUNTER — Encounter: Payer: Self-pay | Admitting: Podiatry

## 2017-02-22 ENCOUNTER — Ambulatory Visit: Payer: BLUE CROSS/BLUE SHIELD | Admitting: Podiatry

## 2017-02-22 DIAGNOSIS — M779 Enthesopathy, unspecified: Secondary | ICD-10-CM

## 2017-02-22 MED ORDER — TRIAMCINOLONE ACETONIDE 10 MG/ML IJ SUSP
10.0000 mg | Freq: Once | INTRAMUSCULAR | Status: AC
  Administered 2017-02-22: 10 mg

## 2017-02-22 NOTE — Progress Notes (Signed)
Subjective:   Patient ID: Julie Valentine, female   DOB: 47 y.o.   MRN: [id number]   HPI Patient presents stating she is ready for surgery on her right foot but the left one is really bothering her on the side and she wants to know if we can do anything to help it   ROS      Objective:  Physical Exam  Neurovascular status intact digital perfusion normal with patient stating she is significantly reducing her smoking.  On the lateral side of the fifth MPJ there continues to be inflammation around the plantar joint with keratotic lesion formation with significant structural deformity right with hammertoe deformity structural bunion chronic lesion sub-third right and hammertoe deformity fifth right     Assessment:  Reviewed both conditions and again reviewed surgery for the right foot.  For the left I did a proximal nerve block and I reviewed tendinitis capsulitis     Plan:  I went ahead and did a block and then did a sterile prep of the left fifth MPJ and injected the plantar capsule 3 mg Kenalog pyelogram Xylocaine debrided lesion and applied thick pad around the area.  Reappoint for surgery February or earlier if needed

## 2017-02-28 ENCOUNTER — Telehealth: Payer: Self-pay | Admitting: *Deleted

## 2017-02-28 NOTE — Telephone Encounter (Signed)
I am calling you in regards to your surgery.  We're going to have to reschedule it.  "Why do we have to reschedule it?"  Who ever gave you that date must not have realized that Dr. Mellody Drown schedule was already full for that date.  "So when can he do it?"  He can do it on 03/28/2017, the following week.  "Go ahead and put me down for then.  I'll check my schedule later.  I can't do it right now because my father is passing away as we speak."  I'm so sorry to hear that, take care.  I called Caren Griffins at the surgery center and rescheduled the surgery from 03/21/2017 to 03/28/2017.

## 2017-03-07 ENCOUNTER — Telehealth: Payer: Self-pay | Admitting: *Deleted

## 2017-03-07 NOTE — Telephone Encounter (Signed)
I attempted to call the patient to reschedule her surgery.  Dr. Paulla Dolly is not doing surgery on 03/28/2017.  I could not leave a message because she had not set up her voicemail.

## 2017-03-08 NOTE — Telephone Encounter (Signed)
I am calling to let you know I need to reschedule your surgery again.  I did not realize that Dr. Paulla Dolly was not doing surgery on 03/31/2017.  "This is the second time you have rescheduled my surgery."  I know and I apologize.  "When can he do it?"  He can do it on March 12, 19, or 26.  "Let's schedule it for March 26.  I already have that day off."  I'll get it rescheduled to 04/18/2017.  Thank you so much.  I called and left a message for Caren Griffins, scheduler at [facility], about the date change from 03/28/2017 to 04/18/2017.

## 2017-03-20 ENCOUNTER — Telehealth: Payer: Self-pay | Admitting: *Deleted

## 2017-03-20 NOTE — Telephone Encounter (Signed)
"  I'm calling to reschedule my surgery."  When are you scheduled?  "I think I'm scheduled for tomorrow.  We rescheduled you to March 5.  "I can't do it then either.  I need something later.  My father just passed and I took time off from work.  So, I need to hold off for a while."  We actually had rescheduled you to March 26.  "Okay, that is fine."  Sorry to hear about your dad.  "Thank you."

## 2017-03-29 ENCOUNTER — Other Ambulatory Visit: Payer: BLUE CROSS/BLUE SHIELD

## 2017-04-03 ENCOUNTER — Telehealth: Payer: Self-pay | Admitting: *Deleted

## 2017-04-03 NOTE — Telephone Encounter (Signed)
"  I need to reschedule my surgery."  Do you have a date in mind that you would like to reschedule it to?  "Whatever his next available date is."  He can do it on April 16.  "No, can't do it then."  What's the next available date after this date."  He's open after this date, just give me a date that's works for you.  "Let's do it on the last Tuesday of April."  That will be April 30.  "Okay, put me down for then."  I'll get it rescheduled.  I called Caren Griffins at [facility] and rescheduled the surgery.

## 2017-04-05 ENCOUNTER — Other Ambulatory Visit: Payer: BLUE CROSS/BLUE SHIELD

## 2017-04-21 ENCOUNTER — Telehealth: Payer: Self-pay | Admitting: *Deleted

## 2017-04-21 NOTE — Telephone Encounter (Signed)
"  I need to reschedule my surgery.  I want to do it towards the end of May."  Are you sure of a date or do you want to hold off and call me back?  "I'm sure, I just can't do it in April.  I have a week off so I can't do it in April.  I'd like to do it like May 21."  That date is available.  I'll get it rescheduled.  I called Caren Griffins at [facility] and rescheduled the surgery from 05/23/2017 to 06/13/2017.

## 2017-04-21 NOTE — Telephone Encounter (Signed)
I will get her post op appointments rescheduled.

## 2017-05-22 ENCOUNTER — Telehealth: Payer: Self-pay | Admitting: *Deleted

## 2017-05-22 NOTE — Telephone Encounter (Signed)
"  I have surgery that's supposed to take place the 20th of May.  Please give me a call back.  I think I need to reschedule that again, please, thank you."

## 2017-05-23 NOTE — Telephone Encounter (Signed)
Thank you :)

## 2017-05-23 NOTE — Telephone Encounter (Addendum)
I am returning your call.  "I'm need to cancel my surgery that's scheduled for May 21.  My baby has to have eye surgery and I don't know how long that's going to take for him to recover.  I'll call back later to reschedule, I have to take care of him."  I understand, I'll let Dr. Paulla Dolly know and cancel at the surgical center.  I called Caren Griffins at the surgical center and canceled the surgery.

## 2017-05-31 ENCOUNTER — Encounter: Payer: BLUE CROSS/BLUE SHIELD | Admitting: Podiatry

## 2017-06-05 DIAGNOSIS — Z319 Encounter for procreative management, unspecified: Secondary | ICD-10-CM | POA: Diagnosis not present

## 2017-06-05 DIAGNOSIS — E288 Other ovarian dysfunction: Secondary | ICD-10-CM | POA: Diagnosis not present

## 2017-10-12 DIAGNOSIS — F321 Major depressive disorder, single episode, moderate: Secondary | ICD-10-CM | POA: Diagnosis not present

## 2017-10-12 DIAGNOSIS — F4310 Post-traumatic stress disorder, unspecified: Secondary | ICD-10-CM | POA: Diagnosis not present

## 2017-11-01 ENCOUNTER — Ambulatory Visit: Payer: BLUE CROSS/BLUE SHIELD | Admitting: Podiatry

## 2017-11-08 ENCOUNTER — Ambulatory Visit: Payer: BLUE CROSS/BLUE SHIELD | Admitting: Podiatry

## 2017-11-14 DIAGNOSIS — F4310 Post-traumatic stress disorder, unspecified: Secondary | ICD-10-CM | POA: Diagnosis not present

## 2017-11-23 ENCOUNTER — Encounter: Payer: Self-pay | Admitting: Podiatry

## 2017-11-23 ENCOUNTER — Ambulatory Visit (INDEPENDENT_AMBULATORY_CARE_PROVIDER_SITE_OTHER): Payer: BLUE CROSS/BLUE SHIELD

## 2017-11-23 ENCOUNTER — Other Ambulatory Visit: Payer: Self-pay | Admitting: Podiatry

## 2017-11-23 ENCOUNTER — Ambulatory Visit: Payer: BLUE CROSS/BLUE SHIELD | Admitting: Podiatry

## 2017-11-23 DIAGNOSIS — M2042 Other hammer toe(s) (acquired), left foot: Secondary | ICD-10-CM

## 2017-11-23 DIAGNOSIS — M21619 Bunion of unspecified foot: Secondary | ICD-10-CM | POA: Diagnosis not present

## 2017-11-23 DIAGNOSIS — M779 Enthesopathy, unspecified: Principal | ICD-10-CM

## 2017-11-23 DIAGNOSIS — M2041 Other hammer toe(s) (acquired), right foot: Secondary | ICD-10-CM

## 2017-11-23 DIAGNOSIS — M778 Other enthesopathies, not elsewhere classified: Secondary | ICD-10-CM

## 2017-11-23 NOTE — Progress Notes (Signed)
Subjective:   Patient ID: Julie Valentine, female   DOB: 47 y.o.   MRN: [id number]   HPI Patient states she is desperate to have surgery on her right foot but she is trying to get it done by the end of the year did lose her son to a accident and it is difficult for her to get it fixed but she wants to do consent form   ROS      Objective:  Physical Exam  Neurovascular status intact negative Homans sign was noted with patient found to have digital deformities digits 2345 right with keratotic lesion structural bunion deformity right and severe lesion fifth metatarsal base right that is very tender when pressed with a lesion on the plantar surface     Assessment:  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  I did explain there is no guarantee excision of plantar skin which will take care of the problem and that complications are present as listed in consent form and patient is comfortable with this and signed consent form.  We will try to get her booked for this surgery and will work with the surgical center to get this done for her and she is encouraged to call with questions  X-ray indicates structural bunion deformity right elevation of the lesser digits right with rotated fifth digit right

## 2017-11-29 ENCOUNTER — Telehealth: Payer: Self-pay | Admitting: *Deleted

## 2017-11-29 NOTE — Telephone Encounter (Signed)
"  I'm calling to schedule my surgery."  Do you have a date in mind that you would like to have it done?  "Well first of all.  How much will I have to pay the surgical center?"  I am not sure.  Let me check with Jocelyn Lamer in our Scientist, clinical (histocompatibility and immunogenetics).  I think she had been trying to call you.  She is not in her office but this note says you will owe the surgical center $2109.  "I have to pay that up front?"  I am not sure, you will need to call and speak to someone at the surgical center.  "I do not.  I spoke to her before.  She was going to contact the surgical center and get the needed information for me.  She knows my circumstances.  Can I speak to her?"  She's in a meeting right now.  "Well can you get her to give me a call back?"  I will let her know.  "Do you know my phone number?"  Yes I do, it is [phone].  "That's right."

## 2017-12-28 ENCOUNTER — Telehealth: Payer: Self-pay | Admitting: *Deleted

## 2017-12-28 NOTE — Telephone Encounter (Signed)
"  I need to schedule my surgery."  Have you signed your consent form?  If you haven't, you will need to schedule an appointment to see Dr. Paulla Dolly for a consultation.  "I think I did when I was there?"  It looks as though you only signed one page, you need to sign two more pages.  "So I can't get a date until I sign those forms?"  We can go ahead and get a date.  He only has December 24 available for the rest of the year.  "Christmas Eve, go ahead and put me down for that day."  I will get you scheduled.  You will still need to come by the office to sign the other two pages of your consent form.  "I'll come by today after I get off work."  What time do you get off work?"  I get off at 3:00.  What's your name?"  My name is Julie Valentine.

## 2017-12-28 NOTE — Telephone Encounter (Signed)
Ms. Julie Valentine came by the office and signed the consent form.  She stated she did not receive instructions for the surgery as well as a surgical kit.  I gave her the instructions as well as the surgical center brochure, an ice pack, and a scrub brush.

## 2018-01-15 DIAGNOSIS — M2041 Other hammer toe(s) (acquired), right foot: Secondary | ICD-10-CM | POA: Diagnosis not present

## 2018-01-16 ENCOUNTER — Encounter: Payer: Self-pay | Admitting: Podiatry

## 2018-01-16 DIAGNOSIS — K219 Gastro-esophageal reflux disease without esophagitis: Secondary | ICD-10-CM | POA: Diagnosis not present

## 2018-01-16 DIAGNOSIS — M2041 Other hammer toe(s) (acquired), right foot: Secondary | ICD-10-CM | POA: Diagnosis not present

## 2018-01-16 DIAGNOSIS — B078 Other viral warts: Secondary | ICD-10-CM | POA: Diagnosis not present

## 2018-01-16 DIAGNOSIS — M2011 Hallux valgus (acquired), right foot: Secondary | ICD-10-CM | POA: Diagnosis not present

## 2018-01-16 DIAGNOSIS — L570 Actinic keratosis: Secondary | ICD-10-CM | POA: Diagnosis not present

## 2018-01-16 DIAGNOSIS — M2031 Hallux varus (acquired), right foot: Secondary | ICD-10-CM | POA: Diagnosis not present

## 2018-01-16 DIAGNOSIS — D2371 Other benign neoplasm of skin of right lower limb, including hip: Secondary | ICD-10-CM | POA: Diagnosis not present

## 2018-01-16 HISTORY — PX: BUNIONECTOMY WITH HAMMERTOE RECONSTRUCTION: SHX5600

## 2018-01-22 ENCOUNTER — Telehealth: Payer: Self-pay | Admitting: Podiatry

## 2018-01-22 ENCOUNTER — Ambulatory Visit (INDEPENDENT_AMBULATORY_CARE_PROVIDER_SITE_OTHER): Payer: Self-pay | Admitting: Podiatry

## 2018-01-22 ENCOUNTER — Encounter: Payer: Self-pay | Admitting: Podiatry

## 2018-01-22 ENCOUNTER — Ambulatory Visit (INDEPENDENT_AMBULATORY_CARE_PROVIDER_SITE_OTHER): Payer: BLUE CROSS/BLUE SHIELD

## 2018-01-22 VITALS — Temp 98.4°F

## 2018-01-22 DIAGNOSIS — M2041 Other hammer toe(s) (acquired), right foot: Secondary | ICD-10-CM | POA: Diagnosis not present

## 2018-01-22 DIAGNOSIS — M2042 Other hammer toe(s) (acquired), left foot: Secondary | ICD-10-CM

## 2018-01-22 DIAGNOSIS — Z9889 Other specified postprocedural states: Secondary | ICD-10-CM

## 2018-01-22 DIAGNOSIS — M779 Enthesopathy, unspecified: Secondary | ICD-10-CM

## 2018-01-22 DIAGNOSIS — M778 Other enthesopathies, not elsewhere classified: Secondary | ICD-10-CM

## 2018-01-22 MED ORDER — HYDROCODONE-ACETAMINOPHEN 10-325 MG PO TABS: 1.0000 | ORAL_TABLET | Freq: Three times a day (TID) | ORAL | 0 refills | Status: DC | PRN

## 2018-01-22 NOTE — Telephone Encounter (Signed)
Patient called the answering service  at 10:14 pm saying she fell and injured her foot and she feels she may have turned the rod in her foot.  Her foot is now numb in her foot.  I called her back at 10:17 and got her answering machine .  I identified myself and never heard back from her.  Gardiner Barefoot DPM

## 2018-01-24 NOTE — Progress Notes (Signed)
   Subjective:  Patient presents today status post right forefoot reconstruction surgery. DOS: 01/16/18. She reports throbbing pain at night. She has been using the pain medications every 6-8 hours as needed which help alleviate her symptoms. Patient is here for further evaluation and treatment.    Past Medical History:  Diagnosis Date  . Abdominal pain   . Fibromyalgia   . GERD (gastroesophageal reflux disease)   . Heart murmur   . Migraines    migraines      Objective/Physical Exam Neurovascular status intact.  Skin incisions appear to be well coapted with sutures and staples intact. No sign of infectious process noted. No dehiscence. No active bleeding noted. Moderate edema noted to the surgical extremity.  Radiographic Exam:  Orthopedic hardware and osteotomies sites appear to be stable with routine healing.  Assessment: 1. s/p right forefoot reconstructive surgery. DOS: 01/16/18   Plan of Care:  1. Patient was evaluated. X-rays reviewed 2. Dressing changed. Keep clean, dry and intact for one week.  3. Continue weightbearing in CAM boot.  4. Prescription for Vicodin 5/325 mg #30 provided to patient.  5. Return to clinic one week post op.    Edrick Kins, DPM [facility]  Dr. Edrick Kins, [place]                                        [place], [place] [address]                Office [phone]  Fax [phone]

## 2018-02-07 ENCOUNTER — Ambulatory Visit (INDEPENDENT_AMBULATORY_CARE_PROVIDER_SITE_OTHER): Payer: BLUE CROSS/BLUE SHIELD

## 2018-02-07 ENCOUNTER — Ambulatory Visit (INDEPENDENT_AMBULATORY_CARE_PROVIDER_SITE_OTHER): Payer: BLUE CROSS/BLUE SHIELD | Admitting: Podiatry

## 2018-02-07 DIAGNOSIS — M778 Other enthesopathies, not elsewhere classified: Secondary | ICD-10-CM

## 2018-02-07 DIAGNOSIS — M779 Enthesopathy, unspecified: Secondary | ICD-10-CM | POA: Diagnosis not present

## 2018-02-07 DIAGNOSIS — Z9889 Other specified postprocedural states: Secondary | ICD-10-CM

## 2018-02-07 DIAGNOSIS — M21619 Bunion of unspecified foot: Secondary | ICD-10-CM

## 2018-02-07 DIAGNOSIS — R600 Localized edema: Secondary | ICD-10-CM

## 2018-02-07 MED ORDER — OXYCODONE-ACETAMINOPHEN 5-325 MG PO TABS: 1.0000 | ORAL_TABLET | Freq: Four times a day (QID) | ORAL | 0 refills | Status: DC | PRN

## 2018-02-07 NOTE — Progress Notes (Signed)
oxyco 

## 2018-02-08 NOTE — Progress Notes (Signed)
Subjective:   Patient ID: Julie Valentine, female   DOB: 48 y.o.   MRN: [id number]   HPI Patient presents stating that she was quite active and her foot started to swell some and it is sore.  Patient states overall she is doing well but she did have extensive forefoot reconstruction   ROS      Objective:  Physical Exam  Neurovascular status intact negative Homans sign was noted with patient found to have pins in place second third fourth digits wound edges well coapted with good alignment of the first metatarsal.  Incision on the lateral side of the plantar fifth metatarsal is healing well but there was a lot of stress on that secondary to the extent of the lesion preoperatively     Assessment:  Overall doing well with extensive forefoot reconstruction and may have been hyperactive developing swelling of her forefoot and midfoot right     Plan:  H&P x-ray reviewed and at this point I advised on the importance of elevation and I applied Unna boot to help reduce the swelling and reduce the strain on her feet.  I am going to see her back in 2 weeks for pin removal and suture removal at the same time and I advised on continued immobilization until that time.  She will be seen back and is encouraged to call us with any questions prior and also was given a new prescription for pain medication oxycodone due to the swelling and discomfort she is experiencing  X-rays indicate the pins are in excellent alignment and the first metatarsal is healing very well with good reduction of the intermetatarsal angle

## 2018-02-14 ENCOUNTER — Ambulatory Visit (INDEPENDENT_AMBULATORY_CARE_PROVIDER_SITE_OTHER): Payer: Self-pay | Admitting: Podiatry

## 2018-02-14 ENCOUNTER — Ambulatory Visit (INDEPENDENT_AMBULATORY_CARE_PROVIDER_SITE_OTHER): Payer: BLUE CROSS/BLUE SHIELD

## 2018-02-14 DIAGNOSIS — M21619 Bunion of unspecified foot: Secondary | ICD-10-CM

## 2018-02-14 DIAGNOSIS — Z9889 Other specified postprocedural states: Secondary | ICD-10-CM

## 2018-02-14 DIAGNOSIS — M779 Enthesopathy, unspecified: Secondary | ICD-10-CM

## 2018-02-14 DIAGNOSIS — M778 Other enthesopathies, not elsewhere classified: Secondary | ICD-10-CM

## 2018-02-14 MED ORDER — HYDROCODONE-ACETAMINOPHEN 10-325 MG PO TABS: 1.0000 | ORAL_TABLET | Freq: Three times a day (TID) | ORAL | 0 refills | Status: DC | PRN

## 2018-02-14 MED ORDER — DIAZEPAM 10 MG PO TABS: 10.0000 mg | ORAL_TABLET | Freq: Two times a day (BID) | ORAL | 0 refills | Status: DC | PRN

## 2018-02-14 MED ORDER — DIAZEPAM 10 MG PO TABS: 20.0000 mg | ORAL_TABLET | Freq: Once | ORAL | 0 refills | Status: AC

## 2018-02-14 NOTE — Progress Notes (Signed)
Subjective:   Patient ID: Julie Valentine, female   DOB: 48 y.o.   MRN: [id number]   HPI Patient presents stating my right foot is still very sensitive and I am scared about getting these pin removed   ROS      Objective:  Physical Exam  Neurovascular status intact with stitches in place second third and fourth digits right outside right foot with pins in place second third fourth toes     Assessment:  Chronic skin condition with stitches intact pins intact     Plan:  A number of the stitches removed but she is not able to tolerate removal of all of them and she is way too nervous for pin removal today.  At this point we can have her back next week we will place her on Valium prior and pain medication to try to allow [place] to do the final procedures that need to be done for this particular condition.  Overall is healing well  X-ray indicates that the digits are all in good alignment pins in place first metatarsal in place despite trauma that she is done to her foot

## 2018-02-14 NOTE — Progress Notes (Signed)
valium 

## 2018-02-21 ENCOUNTER — Ambulatory Visit (INDEPENDENT_AMBULATORY_CARE_PROVIDER_SITE_OTHER): Payer: Self-pay | Admitting: Podiatry

## 2018-02-21 DIAGNOSIS — M779 Enthesopathy, unspecified: Secondary | ICD-10-CM

## 2018-02-21 DIAGNOSIS — Z9889 Other specified postprocedural states: Secondary | ICD-10-CM

## 2018-02-21 DIAGNOSIS — M21619 Bunion of unspecified foot: Secondary | ICD-10-CM

## 2018-02-21 DIAGNOSIS — M778 Other enthesopathies, not elsewhere classified: Secondary | ICD-10-CM

## 2018-02-22 NOTE — Progress Notes (Signed)
Subjective:   Patient ID: Julie Valentine, female   DOB: 48 y.o.   MRN: [id number]   HPI Patient states she is doing well but she is still nervous about getting her stitches out and wants to have her pins taken out   ROS      Objective:  Physical Exam  Neurovascular status intact with excellent alignment and wound edges that are coapting well with patients that is extremely agitated despite Valium that we gave her today and simply does not let her [place] to touch her feet and take her stitches out.  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  Patient will be seen back to recheck in 3 to 4 weeks or if she cannot take out her stitches

## 2018-02-23 ENCOUNTER — Telehealth: Payer: Self-pay | Admitting: *Deleted

## 2018-02-23 NOTE — Telephone Encounter (Signed)
Pt states she wants to know if Dr. Paulla Dolly will put her to sleep to take her stitches out she has tried and they hurt too bad.

## 2018-02-23 NOTE — Telephone Encounter (Signed)
I told pt I would inform Dr. Paulla Dolly and offered to make an appt where she would have the numbing cream. Pt states she already tried that and wants to be put to sleep.

## 2018-03-05 NOTE — Telephone Encounter (Signed)
left message informing pt of Dr. Mellody Drown statement.

## 2018-03-05 NOTE — Telephone Encounter (Signed)
Cannot be put to sleep for stitch removal

## 2018-03-21 ENCOUNTER — Ambulatory Visit (INDEPENDENT_AMBULATORY_CARE_PROVIDER_SITE_OTHER): Payer: BLUE CROSS/BLUE SHIELD

## 2018-03-21 ENCOUNTER — Encounter: Payer: Self-pay | Admitting: Podiatry

## 2018-03-21 ENCOUNTER — Ambulatory Visit (INDEPENDENT_AMBULATORY_CARE_PROVIDER_SITE_OTHER): Payer: BLUE CROSS/BLUE SHIELD | Admitting: Podiatry

## 2018-03-21 DIAGNOSIS — M2011 Hallux valgus (acquired), right foot: Secondary | ICD-10-CM

## 2018-03-21 DIAGNOSIS — M2041 Other hammer toe(s) (acquired), right foot: Secondary | ICD-10-CM

## 2018-03-21 DIAGNOSIS — Z9889 Other specified postprocedural states: Secondary | ICD-10-CM

## 2018-03-22 NOTE — Progress Notes (Signed)
Subjective:   Patient ID: Julie Valentine, female   DOB: 48 y.o.   MRN: [id number]   HPI Patient states that she is improving but the end of her toes are still very sore and her skin is very irritated.  She states she is trying to take her stitches out and that she is still refusing to allow [place] to do it and will not allow [place] to touch her foot at the current time   ROS      Objective:  Physical Exam  Neurovascular status intact negative Homans sign noted with patient's right foot overall healing well from a structural perspective but the skin is getting irritated with several stitches still in place absolutely refuses to let me work on even though today I offered her the opportunity to areas and the stitches     Assessment:  Overall patient is doing well post surgery with several stitches still intact and overall healing well with patient having a lot of actual stress on her skin which is part of her preoperative pathology     Plan:  H&P reviewed her x-rays and again try to encourage her to allow me to take her stitches out but she is refusing.  She will continue to soak and they should come out uneventfully but it is irritating her skin and she understands this complication and is willing to accept it.  Patient will be seen back in 4 weeks or earlier if needed  X-ray indicates structurally she is healing well with good correction of the underlying bunion deformity and good digital position given her significant preoperative condition

## 2018-03-26 ENCOUNTER — Telehealth: Payer: Self-pay | Admitting: Podiatry

## 2018-03-26 NOTE — Telephone Encounter (Signed)
Left message informing pt I would need to discuss with Dr.Regal her request for pain medication, she is not able to work on the pain medication given for the surgery, and he may want to modify her work status.

## 2018-03-26 NOTE — Telephone Encounter (Signed)
Pt called stating she went back to work last week and since then has had pain and swelling in her surgery foot, DOS 12.24.19. Pt is requesting that we send in a prescription for a pain medication. Please give patient a call.

## 2018-03-30 ENCOUNTER — Encounter: Payer: Self-pay | Admitting: *Deleted

## 2018-03-30 NOTE — Telephone Encounter (Signed)
She should not need any pain med at this time

## 2018-03-30 NOTE — Telephone Encounter (Signed)
I informed pt of Dr. Mellody Drown statement and pt states she is fine it was just that first day back at work and she was able to manage the pain with Ibuprofen, and felt she needed to wear the surgical boot another week and would like to pick up at note on Monday. I told pt I would have the note ready for pick up.

## 2018-04-30 ENCOUNTER — Encounter: Payer: BLUE CROSS/BLUE SHIELD | Admitting: Podiatry

## 2018-05-02 ENCOUNTER — Encounter: Payer: BLUE CROSS/BLUE SHIELD | Admitting: Podiatry

## 2018-06-06 ENCOUNTER — Telehealth: Payer: Self-pay | Admitting: *Deleted

## 2018-06-06 ENCOUNTER — Other Ambulatory Visit: Payer: Self-pay

## 2018-06-06 ENCOUNTER — Encounter: Payer: Self-pay | Admitting: Podiatry

## 2018-06-06 ENCOUNTER — Ambulatory Visit (INDEPENDENT_AMBULATORY_CARE_PROVIDER_SITE_OTHER): Payer: BLUE CROSS/BLUE SHIELD | Admitting: Podiatry

## 2018-06-06 ENCOUNTER — Ambulatory Visit (INDEPENDENT_AMBULATORY_CARE_PROVIDER_SITE_OTHER): Payer: BLUE CROSS/BLUE SHIELD

## 2018-06-06 VITALS — Temp 97.3°F

## 2018-06-06 DIAGNOSIS — M2011 Hallux valgus (acquired), right foot: Secondary | ICD-10-CM

## 2018-06-06 MED ORDER — DOXYCYCLINE HYCLATE 100 MG PO TABS: 100.0000 mg | ORAL_TABLET | Freq: Two times a day (BID) | ORAL | 0 refills | Status: DC

## 2018-06-06 NOTE — Addendum Note (Signed)
Addended by: Celene Skeen A on: 06/06/2018 04:31 PM   Modules accepted: Orders

## 2018-06-06 NOTE — Telephone Encounter (Signed)
Patient called today and wanted to speak to me, Rolly Pancake, CMA (AAMA). Patient refused to talk to [facility], RN about why she wanted to speak to me.  During patient's visit with Dr. Paulla Dolly today, Wednesday, Jun 06, 2018 at 11:15 am, patient refused for me and for Dr. Paulla Dolly to remove patient's stitches. Patient had surgery with Dr. Paulla Dolly on 01/16/2018 for [facility] w/pin 2, 3, 4 RT; arthroplasty 5th RT; Removal wedge outside RT.  I left a voicemail message for patient at [phone] to call me back.  Waiting for a response.

## 2018-06-06 NOTE — Telephone Encounter (Signed)
Called patient back at [phone] to let them know that we sent over a Prescription for Doxycyline 100 mg bid x10 days to equal a total of 20 pills.  I did let patient know that Dr. Paulla Dolly would not be prescribing pain pills for her since he did not feel she needed them. I instructed patient to take either Tylenol or Ibuprofen for any discomfort she is feeling in her foot. Patient stated she understood.  Also, per Dr.Regal's request, I told the patient that "Dr. Paulla Dolly felt badly that she did not feel she had a good office visit today. That he did not think she had an infection in her foot, but did prescribe an antibiotic per her request. Dr.Regal is happy to help her in the future if she would like to come back and see him".  Patient complained again that when she left, she was bleeding from the callous that was trimmed and she walked all the way back to her car with it bleeding. I told the patient she should have let someone know and that we would have been happy to have helped her. Patient did not give me a response back to this question.

## 2018-06-06 NOTE — Telephone Encounter (Addendum)
Patient called me back from [phone] to talk about how upset she was with her visit with Dr. Paulla Dolly. During the patient's office visit today, 06/06/18, patient refused to allow me, Rolly Pancake, CMA (AAMA) and Dr.Regal to remove her stitches. Dr. Paulla Dolly also offered to numb her foot up to take the stitches out, but the patient refused that offer as well.   Patient had surgery with Dr. Paulla Dolly on 01/16/18 Liane Comber Bunionectomy RT; Hammertoe Repair 2, 3, 4 with pin RT and without pin 5th RT; Excision Benign Lesion 4.0 cm RT.  Patient stated, "Dr. Paulla Dolly brushed me off in the room today. Dr. Paulla Dolly told me that my stitches would just fall out on their own. He did not give me an antibiotic for my foot. The Nurse I work with at work told me they thought I had an infection in my foot and I want to get an antibiotic".  Patient also stated, "What am I supposed to do about the pain? Can I get pain medicine?" I told patient that I would discuss this with Dr. Paulla Dolly.  Patient went on to say, "Dr. Paulla Dolly scrapped a callous off my foot. When I got to the car, it was bleeding so much that I had to wring out my sock. All Dr.Regal did was put a band aid on it." I asked patient why she did not come back into the office for [place] to help her with this. Pt stated, "She did not want to come back inside she just wanted to leave".  Patient ended the conversation with telling me that she was going to seek another Podiatry office for a second opinion. I told patient that I was sorry she had a bad office visit and that if she wanted, we could have her see another doctor in our office. Patient declined.  Dr. Paulla Dolly dispensed Doxycycline 100 mg bid x10 days. Dr. Paulla Dolly did no prescribe pain medicine because patient does not need pain medicine.

## 2018-06-06 NOTE — Progress Notes (Addendum)
Subjective:   Patient ID: Julie Valentine, female   DOB: 48 y.o.   MRN: [id number]   HPI Patient presents stating my foot still hurts at times and I still have some stitches in my toes and I'm scared to have anyone work on them. Overall she states the callus tissue she had before surgery is much better   ROS      Objective:  Physical Exam  Neurovascular status was found to be intact with patient actually having a significant reduction of callus tissue plantar right with good digital perfusion and position and on the base of the fifth metatarsal the keratotic tissue has resolved quite a bit with a diminishment of pressure against the area.  Patient still has some stitches in place in the lesser digits but she is refusing anyone to work on them. The overall position of the toes looks good and the plantar callus tissue has resolved with the surgery. The incisions on the toes are irritated secondary to the stitches in place 2,3,4 toes. The stitches are getting progessively looser as time goes on and a number of them have fallen out     Assessment:  Patient has excellent resolution of her preoperative severe callus formation but does have irritation of her incision sites secondary to refusal to allow removal of stitches. They do appear to be gradually loosening     Plan:  Reviewed x-rays and spent a great deal of time discussing with her stitch removal and she simply is refusing [place] to do it at this time.  I do think they are getting loose and they will come out eventually and I have advised that since she does not have current infection they are only irritating her skin and she is completely aware that and is just willing to accept it.  She will be seen back as needed and overall I am ecstatic that the plantar calluses have gone away and the base of the fifth metatarsal callus for the most part is gone. I did debride tissue on the plantar lateral surface to rule out pathology and she jumped and did  receive a small cut that I applied bandaid with neosporin to and advised on soaks and bandaid and to come in if it irritates her  X-rays indicate good structural alignment with good position component with everything intact currently as far as fixation

## 2018-06-21 ENCOUNTER — Telehealth: Payer: Self-pay | Admitting: Podiatry

## 2018-06-21 NOTE — Telephone Encounter (Signed)
Patient has called and asked to speak to Juliann Pulse, she was returning a phone call

## 2018-06-28 ENCOUNTER — Telehealth: Payer: Self-pay | Admitting: Podiatry

## 2018-07-02 ENCOUNTER — Ambulatory Visit: Payer: BC Managed Care – PPO | Admitting: Podiatry

## 2018-07-02 ENCOUNTER — Other Ambulatory Visit: Payer: Self-pay

## 2018-07-09 ENCOUNTER — Ambulatory Visit: Payer: BC Managed Care – PPO | Admitting: Podiatry

## 2018-07-16 ENCOUNTER — Telehealth: Payer: Self-pay | Admitting: Podiatry

## 2018-07-16 NOTE — Telephone Encounter (Signed)
I called pt and offered an appt to get the sutures taken out and pt states she has to see a doctor to have them removed because they have been in so long. I asked if pt had an appt to see Dr. Amalia Hailey and she stated no, I told her I would transfer to the scheduler to get her in. Transferred to scheduler.

## 2018-07-16 NOTE — Telephone Encounter (Signed)
Please call patient about suture removal.  Stated that she cant wait to see Dr Amalia Hailey as directed and she is unwilling to have nurse do this.

## 2018-07-25 ENCOUNTER — Encounter: Payer: Self-pay | Admitting: Podiatry

## 2018-07-25 ENCOUNTER — Ambulatory Visit: Payer: BC Managed Care – PPO | Admitting: Podiatry

## 2018-07-25 ENCOUNTER — Other Ambulatory Visit: Payer: Self-pay

## 2018-07-25 ENCOUNTER — Ambulatory Visit (INDEPENDENT_AMBULATORY_CARE_PROVIDER_SITE_OTHER): Payer: BC Managed Care – PPO

## 2018-07-25 VITALS — Temp 98.0°F

## 2018-07-25 DIAGNOSIS — M2011 Hallux valgus (acquired), right foot: Secondary | ICD-10-CM | POA: Diagnosis not present

## 2018-07-25 DIAGNOSIS — M7751 Other enthesopathy of right foot: Secondary | ICD-10-CM

## 2018-07-25 DIAGNOSIS — M795 Residual foreign body in soft tissue: Secondary | ICD-10-CM

## 2018-07-25 DIAGNOSIS — Z9889 Other specified postprocedural states: Secondary | ICD-10-CM

## 2018-07-25 DIAGNOSIS — L989 Disorder of the skin and subcutaneous tissue, unspecified: Secondary | ICD-10-CM | POA: Diagnosis not present

## 2018-07-25 NOTE — Patient Instructions (Signed)
Pre-Operative Instructions  Congratulations, you have decided to take an important step towards improving your quality of life.  You can be assured that the doctors and staff at [facility] will be with you every step of the way.  Here are some important things you should know:  1. Plan to be at the surgery center/hospital at least 1 (one) hour prior to your scheduled time, unless otherwise directed by the surgical center/hospital staff.  You must have a responsible adult accompany you, remain during the surgery and drive you home.  Make sure you have directions to the surgical center/hospital to ensure you arrive on time. 2. If you are having surgery at [facility] or [facility] hospitals, you will need a copy of your medical history and physical form from your family physician within one month prior to the date of surgery. We will give you a form for your primary physician to complete.  3. We make every effort to accommodate the date you request for surgery.  However, there are times where surgery dates or times have to be moved.  We will contact you as soon as possible if a change in schedule is required.   4. No aspirin/ibuprofen for one week before surgery.  If you are on aspirin, any non-steroidal anti-inflammatory medications (Mobic, Aleve, Ibuprofen) should not be taken seven (7) days prior to your surgery.  You make take Tylenol for pain prior to surgery.  5. Medications - If you are taking daily heart and blood pressure medications, seizure, reflux, allergy, asthma, anxiety, pain or diabetes medications, make sure you notify the surgery center/hospital before the day of surgery so they can tell you which medications you should take or avoid the day of surgery. 6. No food or drink after midnight the night before surgery unless directed otherwise by surgical center/hospital staff. 7. No alcoholic beverages 24-hours prior to surgery.  No smoking 24-hours prior or 24-hours after  surgery. 8. Wear loose pants or shorts. They should be loose enough to fit over bandages, boots, and casts. 9. Don't wear slip-on shoes. Sneakers are preferred. 10. Bring your boot with you to the surgery center/hospital.  Also bring crutches or a walker if your physician has prescribed it for you.  If you do not have this equipment, it will be provided for you after surgery. 11. If you have not been contacted by the surgery center/hospital by the day before your surgery, call to confirm the date and time of your surgery. 12. Leave-time from work may vary depending on the type of surgery you have.  Appropriate arrangements should be made prior to surgery with your employer. 13. Prescriptions will be provided immediately following surgery by your doctor.  Fill these as soon as possible after surgery and take the medication as directed. Pain medications will not be refilled on weekends and must be approved by the doctor. 14. Remove nail polish on the operative foot and avoid getting pedicures prior to surgery. 15. Wash the night before surgery.  The night before surgery wash the foot and leg well with water and the antibacterial soap provided. Be sure to pay special attention to beneath the toenails and in between the toes.  Wash for at least three (3) minutes. Rinse thoroughly with water and dry well with a towel.  Perform this wash unless told not to do so by your physician.  Enclosed: 1 Ice pack (please put in freezer the night before surgery)   1 Hibiclens skin cleaner     Pre-op instructions  If you have any questions regarding the instructions, please do not hesitate to call our office.  [place]: [address], [place], [place] [address] -- [phone]  [place]: [address] [place], [place] [address] -- [phone]  [place]: [address]  [place], [place] [address] -- [phone]  [place]: [address], [address], [place], [place] [address] -- [phone]  Website:  [email or web address] 

## 2018-07-30 NOTE — Progress Notes (Signed)
   HPI: 48 year old female presents the office today after having bunion surgery on 01/16/2018.  Over the course of the postoperative management the patient has refused to have the sutures taken out.  She says that her skin is too sensitive to have the sutures removed.  She has been applying antibiotic ointment and bandages to the toes.  Patient presents today with pain associated with intact sutures 6 months postop.  She presents today for further treatment and evaluation  Past Medical History:  Diagnosis Date  . Abdominal pain   . Fibromyalgia   . GERD (gastroesophageal reflux disease)   . Heart murmur   . Migraines    migraines     Physical Exam: General: The patient is alert and oriented x3 in no acute distress.  Dermatology: Skin is warm, dry and supple bilateral lower extremities. Negative for open lesions or macerations.  Foreign body granulomas appear to be developing around the intact sutures that are nonabsorbable to the digits 2-5.  No open wound no drainage.  No malodor noted.  There is some very tender hyperkeratotic callus lesions noted to the right foot x2.  Vascular: Palpable pedal pulses bilaterally. No edema or erythema noted. Capillary refill within normal limits.  Neurological: Epicritic and protective threshold grossly intact bilaterally.   Musculoskeletal Exam: Range of motion within normal limits to all pedal and ankle joints bilateral. Muscle strength 5/5 in all groups bilateral.  Tenderness to palpation along the surgical forefoot entirely.  Patient is very protective of her surgical forefoot.  Assessment: 1.  Foreign bodies right forefoot (nonabsorbable sutures 2-5 right) 2.  Hyperkeratotic callus lesions right fifth met tubercle.  Right second toe. 3.  Capsulitis right forefoot MPJs 2-5   Plan of Care:  1. Patient evaluated.  2.  Today after discussion with the patient she would not have the sutures taken out unless she is asleep under anesthesia.  Today we  are going to obtain surgical consent to take the patient to the surgical center for IV sedation and suture removal. 3.  All possible complications and details the procedure were explained.  No guarantees were expressed or implied.  All patient questions answered.  While the patient is understood anesthesia she will also benefit from debridement of the callus lesions as well as anti-inflammatory injections to the forefoot MPJs. 4.  Authorization for surgery initiated today.  Surgery will consist of removal foreign bodies, sutures 2-5 right foot.  Excision of benign lesions x2 right foot.  Anti-inflammatory steroid injections MPJs 2-5 right foot. 5.  Return to clinic 1 week postop      Edrick Kins, DPM [facility]  Dr. Edrick Kins, DPM    [address], [place] [address]                Office [phone]  Fax [phone]

## 2018-07-31 ENCOUNTER — Telehealth: Payer: Self-pay | Admitting: Podiatry

## 2018-07-31 NOTE — Telephone Encounter (Signed)
Pt wants to know what kind of stitches are going to be used for her surgery on 15 July. States she wants to know the healing time frame as she is going out of town the following week.

## 2018-08-01 NOTE — Telephone Encounter (Signed)
I am returning your call.  You want to reschedule your surgery date?  "Yes, I do.  What can he do after July 26?"  He can do it on September 20, 2018.  "What do you have after that?"  Dr. Amalia Hailey can do any Thursday in September.  "Let me call you back in the morning.  I need to check my school schedule."  I canceled her surgery for 08/08/2018 via the surgical center's [employer] Portal.

## 2018-08-01 NOTE — Telephone Encounter (Signed)
Pt called back about her sx on July 15. Please call Patient

## 2018-08-01 NOTE — Telephone Encounter (Signed)
Called pt back regarding questions about July 15th surgery. "Just want to know will he be using the same kind of stitches or the dissolvable ones?" Informed pt per Dr Amalia Hailey, he will be using the dissolvable ones so you don't have to worry about having them removed. "And how long will it be before I can take a shower?" Informed pt per Dr Amalia Hailey, It will be about 2-3 wks before you can wet surgical foot. "Well I will have to reschedule. I'm going on vacation on the 19th and want to be able to enjoy my vacation." I will have the surgery coordination, Delydia, give you a call to reschedule.

## 2018-08-21 ENCOUNTER — Telehealth: Payer: Self-pay | Admitting: *Deleted

## 2018-08-21 NOTE — Telephone Encounter (Signed)
"  I'm calling to schedule my surgery."  Dr. Amalia Hailey doesn't have anything available until September.  "He said to schedule me for a lunch time case."  Yes, I'm aware.  "Do I need to speak to the nurse and see what she can schedule for me?"  No, I'm the surgery scheduler.  He can possibly do it on September 06, 2018.  "What time?"  I can't give you a time, all I can tell you is that it will be sometime that morning.  Do you want that date?  "Yes, go ahead and put me down for then."

## 2018-08-23 ENCOUNTER — Telehealth: Payer: Self-pay | Admitting: *Deleted

## 2018-08-23 ENCOUNTER — Telehealth: Payer: Self-pay | Admitting: Podiatry

## 2018-08-23 NOTE — Telephone Encounter (Signed)
DOS 09/06/2018; EXCISION FOREIGN BODY- COMPLICATED - 53299, REMOVAL MULTIPLE LESIONS X 4 - 20600, AND EXCISION BENIGN LESION - 11421 X 2 RT FOOT  BCBS: Effective Date - 01/24/2018 - 01/23/9998   In-Network    Max Per Benefit Period Year-to-Date Remaining  CoInsurance  20%   Deductible  $2500.00 $2500.00  Out-Of-Pocket  $5000.00 $4707.01   AMBULATORY SURGERY  In Network  Copay Coinsurance  Not Applicable  24% per [place]

## 2018-08-23 NOTE — Telephone Encounter (Signed)
Needs to talk to a nurse about her procedure please call when possible. [phone]

## 2018-08-23 NOTE — Telephone Encounter (Signed)
I called pt she states she is to do a lunch scheduled surgery, was not able to have the surgery on the 15th, because she didn't want to be on crutches during vacation and she has to have a lunch procedure time. I told pt that she would need to speak with the surgery coordinator and transferred.

## 2018-08-27 ENCOUNTER — Telehealth: Payer: Self-pay | Admitting: Podiatry

## 2018-08-27 NOTE — Telephone Encounter (Signed)
Would like to talk about surgery ASAP

## 2018-08-27 NOTE — Telephone Encounter (Signed)
Would like to talk to Dr Amalia Hailey nurse ASAP about surgery. Please call.

## 2018-08-28 ENCOUNTER — Other Ambulatory Visit: Payer: Self-pay | Admitting: Pediatrics

## 2018-08-28 MED ORDER — PERMETHRIN 5 % EX CREA: 1.0000 "application " | TOPICAL_CREAM | Freq: Once | CUTANEOUS | 1 refills | Status: DC

## 2018-08-28 NOTE — Telephone Encounter (Signed)
I called pt and she states she received a call from [facility] and they stated she could not have surgery until her previous bill was paid, and Dr. Paulla Dolly and Dr. 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 I told pt I did not know how to proceed at this point and I would have to message Dr. Paulla Dolly and Dr. Amalia Hailey for more information.

## 2018-08-28 NOTE — Progress Notes (Signed)
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 Kellie Simmering MD

## 2018-08-28 NOTE — Telephone Encounter (Signed)
You can ask Dr. Paulla Dolly, but I would think we should not proceed until bill is paid. She can opt to have sutures taken out in office with Rx for Xanax prior to office visit?  - Dr. Amalia Hailey

## 2018-08-29 ENCOUNTER — Telehealth: Payer: Self-pay | Admitting: Podiatry

## 2018-08-29 NOTE — Telephone Encounter (Signed)
Left message for pt to call to discuss the recommendations of Dr. Paulla Dolly and Dr. Amalia Hailey.

## 2018-08-29 NOTE — Telephone Encounter (Signed)
Pt called and I informed pt of Dr. Paulla Dolly and Dr. Amalia Hailey statement and recommendation. Pt states she has already tried this and she knows it is not going to work because she knows some of the pain is in her mind and she knows she is going to feel it, but she will try anyway. I told pt I would transfer to the current surgery coordinator and once she is scheduled for the in-office surgical procedure, I would inform her surgeon and get the mediation and instructions ordered.

## 2018-08-29 NOTE — Telephone Encounter (Signed)
Office Surgery: 09/05/2018 Ex. Foreign Body-Complicated Rt, Exc. Benign Lesion 1.0 cm x2 Rt, and Steroid Injection x4 Rt  Member Information Member Number: [id number]  Policy Effective : 88/89/1694 - 01/23/9998  Name: Julie Valentine Date of Birth:Jun 18, 1970   Member Liability Summary      In-Network    Max Per Benefit Period Year-to-Date Remaining  CoInsurance  20%   Deductible  $2500.00 $2500.00  Out-Of-Pocket  $5000.00 $4682.01   AMBULATORY SURGERY  In Network  Copay Coinsurance  Not Applicable  50% per [place]

## 2018-08-29 NOTE — Telephone Encounter (Signed)
She continues to refuse our advice for suture removal. We can give her high xanax dose and numb the area in office enviroment. She probably needs to take this route as we do not have control over the surgical center

## 2018-08-29 NOTE — Telephone Encounter (Signed)
Julie Valentine and/or Julie Valentine pease advise.

## 2018-08-29 NOTE — Telephone Encounter (Signed)
Office surgery is scheduled for next Wednesday 08/12 at 12 noon with pt to arrive at 1145.

## 2018-09-04 ENCOUNTER — Telehealth: Payer: Self-pay | Admitting: Podiatry

## 2018-09-04 ENCOUNTER — Telehealth: Payer: Self-pay | Admitting: *Deleted

## 2018-09-04 NOTE — Telephone Encounter (Signed)
Pt has called to cx her appointment/office sx for tomorrow with Dr. Amalia Hailey at 12:00pm.

## 2018-09-04 NOTE — Telephone Encounter (Signed)
"  I need to cancel my surgery that's scheduled for tomorrow."  Would you like to reschedule it?  "No, I'm just going to have to save up the money to do it at the surgery center.  There's no need in me torturing myself."  I'll cancel the appointment.

## 2018-09-05 ENCOUNTER — Ambulatory Visit: Payer: BC Managed Care – PPO | Admitting: Podiatry

## 2018-09-05 ENCOUNTER — Telehealth: Payer: Self-pay | Admitting: Podiatry

## 2018-09-05 NOTE — Telephone Encounter (Signed)
Pt had a few concerns about sx, she wants to speak to a nurse not the sx scheduler

## 2018-09-06 NOTE — Telephone Encounter (Addendum)
I called pt and she states she was not able to come to the surgical appt yesterday "because she had been tormented and tortured" thinking of the pain and she knew that the xanax was not going to help, when she started to feel the pain. Pt states she is going to have to wait until she pays the $1800.00 to the surgical center, but does need an appt to be seen in office, because she has callouses and the areas are draining. Transferred pt to schedulers.

## 2018-09-12 ENCOUNTER — Other Ambulatory Visit: Payer: BC Managed Care – PPO

## 2018-09-19 ENCOUNTER — Ambulatory Visit: Payer: BC Managed Care – PPO | Admitting: Podiatry

## 2018-10-02 DIAGNOSIS — Z20828 Contact with and (suspected) exposure to other viral communicable diseases: Secondary | ICD-10-CM | POA: Diagnosis not present

## 2018-10-08 ENCOUNTER — Ambulatory Visit (INDEPENDENT_AMBULATORY_CARE_PROVIDER_SITE_OTHER): Payer: BC Managed Care – PPO | Admitting: Podiatry

## 2018-10-08 ENCOUNTER — Other Ambulatory Visit: Payer: Self-pay

## 2018-10-08 ENCOUNTER — Ambulatory Visit (INDEPENDENT_AMBULATORY_CARE_PROVIDER_SITE_OTHER): Payer: BC Managed Care – PPO

## 2018-10-08 DIAGNOSIS — M795 Residual foreign body in soft tissue: Secondary | ICD-10-CM | POA: Diagnosis not present

## 2018-10-08 DIAGNOSIS — L989 Disorder of the skin and subcutaneous tissue, unspecified: Secondary | ICD-10-CM

## 2018-10-08 DIAGNOSIS — M779 Enthesopathy, unspecified: Secondary | ICD-10-CM | POA: Diagnosis not present

## 2018-10-08 DIAGNOSIS — M778 Other enthesopathies, not elsewhere classified: Secondary | ICD-10-CM

## 2018-10-09 DIAGNOSIS — Z20828 Contact with and (suspected) exposure to other viral communicable diseases: Secondary | ICD-10-CM | POA: Diagnosis not present

## 2018-10-10 NOTE — Progress Notes (Signed)
HPI: 48 year old female presents the office today after having bunion surgery on 01/16/2018.  Over the course of the postoperative management the patient has refused to have the sutures taken out.  She is also missed several postoperative appointments.  She says that her skin is too sensitive to have the sutures removed.  When I saw her originally on 07/25/2018 I did arrange to take her to the surgery center to have the sutures removed and foreign body abscesses cleaned and irrigated.  She has a balance due at the surgery center and she is unable to have surgery there so she is presenting today to discuss different options to have the sutures removed.  Past Medical History:  Diagnosis Date  . Abdominal pain   . Fibromyalgia   . GERD (gastroesophageal reflux disease)   . Heart murmur   . Migraines    migraines     Physical Exam: General: The patient is alert and oriented x3 in no acute distress.  Dermatology: Skin is warm, dry and supple bilateral lower extremities. Negative for open lesions or macerations.  Foreign body granulomas with some drainage appear to be developing around the intact sutures that are nonabsorbable to the digits 2-5.  No open wound no drainage.  No malodor noted.  There is some very tender hyperkeratotic callus lesions noted to the right foot x2.  Vascular: Palpable pedal pulses bilaterally. No edema or erythema noted. Capillary refill within normal limits.  Neurological: Epicritic and protective threshold grossly intact bilaterally.   Musculoskeletal Exam: Range of motion within normal limits to all pedal and ankle joints bilateral. Muscle strength 5/5 in all groups bilateral.  Tenderness to palpation along the surgical forefoot entirely.  Patient is very protective of her surgical forefoot.  Assessment: 1.  Foreign bodies right forefoot (nonabsorbable sutures 2-5 right).  Mostly unchanged since last visit 2.  Hyperkeratotic callus lesions right fifth met tubercle.   Right second toe. 3.  Capsulitis right forefoot MPJs 2-5   Plan of Care:  1. Patient evaluated.  2.  Today after discussion with the patient she would not have the sutures taken out unless she is asleep under anesthesia.  Today we are going to obtain surgical consent to take the patient to the surgical center for IV sedation and suture removal. 3.  Today explained the patient will try to have surgery done at the hospital.  She cannot have surgery at the surgery center due to a balance that she owes from prior surgery. 4.  Patient also does not have a established primary care physician.  I explained that she needs a history and physical performed within 30 days of surgery in order to have surgery at the hospital.  She says that she will work on getting a primary care physician. 5.  Instructed the patient to contact our surgery scheduler when she has an appointment set up for her history and physical.  At that time we will work on getting her a surgical date at the hospital.  Surgery will be the same as previously discussed on 07/25/2018. 6.  Return to clinic 1 week postop     Edrick Kins, DPM [facility]  Dr. Edrick Kins, DPM    [address],  [place] [address]                Office [phone]  Fax [phone]

## 2018-10-15 ENCOUNTER — Ambulatory Visit: Payer: BC Managed Care – PPO | Admitting: Podiatry

## 2018-10-15 DIAGNOSIS — Z20828 Contact with and (suspected) exposure to other viral communicable diseases: Secondary | ICD-10-CM | POA: Diagnosis not present

## 2018-10-16 DIAGNOSIS — M79671 Pain in right foot: Secondary | ICD-10-CM | POA: Diagnosis not present

## 2018-10-16 DIAGNOSIS — F419 Anxiety disorder, unspecified: Secondary | ICD-10-CM | POA: Diagnosis not present

## 2018-10-16 DIAGNOSIS — Z6831 Body mass index (BMI) 31.0-31.9, adult: Secondary | ICD-10-CM | POA: Diagnosis not present

## 2018-10-18 ENCOUNTER — Telehealth: Payer: Self-pay | Admitting: *Deleted

## 2018-10-18 DIAGNOSIS — Z01818 Encounter for other preprocedural examination: Secondary | ICD-10-CM

## 2018-10-18 NOTE — Telephone Encounter (Signed)
Left message informing pt I would be out of the office after 1:00pm today, but to call again [phone].

## 2018-10-18 NOTE — Telephone Encounter (Signed)
Patient canceled the office surgery 09/05/2018.

## 2018-10-18 NOTE — Telephone Encounter (Signed)
Pt called states she would like a call as soon as possible.

## 2018-10-18 NOTE — Telephone Encounter (Signed)
Julie Valentine canceled the surgery for 09/06/2018.

## 2018-10-18 NOTE — Telephone Encounter (Signed)
"  I'm calling to see if you have tried to call me about my surgery date.  My phone is out of service right now."  Marya Amsler trying to get you scheduled for October.  "October I can't wait until October.  Let me speak to Dr. Amalia Hailey' nurse."  Are you referring to [facility].  "Yes, I think that's her name."  I transferred her to [facility].  (I was going to offer her November 01, 2018 at [place] but she cut me off.)  I canceled the request for block time at [place] for 11/01/2018 via the online the [employer] Portal.

## 2018-10-18 NOTE — Telephone Encounter (Signed)
"  I am calling to see what date you were scheduling me for in October."  I had put in request for October 8 but I canceled it.  "I wasn't saying I wanted you to cancel it.  I wanted to speak to the nurse first to see if I could get some pain medication.  I haven't been able to contact her.  So can you put the request back in for October 8?"  That date has probably been taken.  Dr. Amalia Hailey does not have block time at [facility] so we have to take whatever is available.  "Well could you check again and let me know by the end of the day?  I will call back at 4:30 pm."  I cannot not promise you that I'll be able to do that.  "I need to know so I can let my job know.  I'll call back at 4:30 pm.  If you don't answer, I'll call you back tomorrow."

## 2018-10-22 DIAGNOSIS — Z20828 Contact with and (suspected) exposure to other viral communicable diseases: Secondary | ICD-10-CM | POA: Diagnosis not present

## 2018-10-23 NOTE — Telephone Encounter (Signed)
I attempted to call Julie Valentine's call.  I left her a message to call me back on tomorrow.  I have not been able to schedule her surgery yet.  I am looking at 11/23/2018.  The time has not been released by other doctors at the surgery center at this time.

## 2018-10-29 DIAGNOSIS — Z20828 Contact with and (suspected) exposure to other viral communicable diseases: Secondary | ICD-10-CM | POA: Diagnosis not present

## 2018-11-05 ENCOUNTER — Other Ambulatory Visit: Payer: Self-pay

## 2018-11-05 ENCOUNTER — Encounter: Payer: Self-pay | Admitting: Podiatry

## 2018-11-05 ENCOUNTER — Ambulatory Visit (INDEPENDENT_AMBULATORY_CARE_PROVIDER_SITE_OTHER): Payer: BC Managed Care – PPO | Admitting: Podiatry

## 2018-11-05 ENCOUNTER — Encounter

## 2018-11-05 VITALS — Temp 97.5°F

## 2018-11-05 DIAGNOSIS — M778 Other enthesopathies, not elsewhere classified: Secondary | ICD-10-CM | POA: Diagnosis not present

## 2018-11-05 DIAGNOSIS — L989 Disorder of the skin and subcutaneous tissue, unspecified: Secondary | ICD-10-CM

## 2018-11-05 DIAGNOSIS — M7751 Other enthesopathy of right foot: Secondary | ICD-10-CM | POA: Diagnosis not present

## 2018-11-05 DIAGNOSIS — M795 Residual foreign body in soft tissue: Secondary | ICD-10-CM | POA: Diagnosis not present

## 2018-11-05 DIAGNOSIS — Z20828 Contact with and (suspected) exposure to other viral communicable diseases: Secondary | ICD-10-CM | POA: Diagnosis not present

## 2018-11-05 NOTE — Patient Instructions (Signed)
Pre-Operative Instructions  Congratulations, you have decided to take an important step towards improving your quality of life.  You can be assured that the doctors and staff at [facility] will be with you every step of the way.  Here are some important things you should know:  1. Plan to be at the surgery center/hospital at least 1 (one) hour prior to your scheduled time, unless otherwise directed by the surgical center/hospital staff.  You must have a responsible adult accompany you, remain during the surgery and drive you home.  Make sure you have directions to the surgical center/hospital to ensure you arrive on time. 2. If you are having surgery at [facility] or [facility] hospitals, you will need a copy of your medical history and physical form from your family physician within one month prior to the date of surgery. We will give you a form for your primary physician to complete.  3. We make every effort to accommodate the date you request for surgery.  However, there are times where surgery dates or times have to be moved.  We will contact you as soon as possible if a change in schedule is required.   4. No aspirin/ibuprofen for one week before surgery.  If you are on aspirin, any non-steroidal anti-inflammatory medications (Mobic, Aleve, Ibuprofen) should not be taken seven (7) days prior to your surgery.  You make take Tylenol for pain prior to surgery.  5. Medications - If you are taking daily heart and blood pressure medications, seizure, reflux, allergy, asthma, anxiety, pain or diabetes medications, make sure you notify the surgery center/hospital before the day of surgery so they can tell you which medications you should take or avoid the day of surgery. 6. No food or drink after midnight the night before surgery unless directed otherwise by surgical center/hospital staff. 7. No alcoholic beverages 24-hours prior to surgery.  No smoking 24-hours prior or 24-hours after  surgery. 8. Wear loose pants or shorts. They should be loose enough to fit over bandages, boots, and casts. 9. Don't wear slip-on shoes. Sneakers are preferred. 10. Bring your boot with you to the surgery center/hospital.  Also bring crutches or a walker if your physician has prescribed it for you.  If you do not have this equipment, it will be provided for you after surgery. 11. If you have not been contacted by the surgery center/hospital by the day before your surgery, call to confirm the date and time of your surgery. 12. Leave-time from work may vary depending on the type of surgery you have.  Appropriate arrangements should be made prior to surgery with your employer. 13. Prescriptions will be provided immediately following surgery by your doctor.  Fill these as soon as possible after surgery and take the medication as directed. Pain medications will not be refilled on weekends and must be approved by the doctor. 14. Remove nail polish on the operative foot and avoid getting pedicures prior to surgery. 15. Wash the night before surgery.  The night before surgery wash the foot and leg well with water and the antibacterial soap provided. Be sure to pay special attention to beneath the toenails and in between the toes.  Wash for at least three (3) minutes. Rinse thoroughly with water and dry well with a towel.  Perform this wash unless told not to do so by your physician.  Enclosed: 1 Ice pack (please put in freezer the night before surgery)   1 Hibiclens skin cleaner     Pre-op instructions  If you have any questions regarding the instructions, please do not hesitate to call our office.  [place]: [address], [place], [place] [address] -- [phone]  [place]: [address] [place], [place] [address] -- [phone]  [place]: [address]  [place], [place] [address] -- [phone]   Website: [email or web address] 

## 2018-11-05 NOTE — Progress Notes (Signed)
   HPI: 48 year old female presents the office today after having bunion surgery on 01/16/2018.  The patient never allowed the physician or office staff to remove her sutures.  She has had pain and tenderness associated with the nonabsorbable sutures in her toes ever since surgery.  She was originally scheduled for suture removal at the [facility] specialty surgery center under IV sedation, however she had a balance due and was unable to have surgery at that location until the balance is paid.  Patient was then scheduled for surgery at the [facility], however it is been rescheduled due to different documented circumstances.  She is concerned today for infection of the toes where the sutures are  Past Medical History:  Diagnosis Date  . Abdominal pain   . Fibromyalgia   . GERD (gastroesophageal reflux disease)   . Heart murmur   . Migraines    migraines     Physical Exam: General: The patient is alert and oriented x3 in no acute distress.  Dermatology: Skin is warm, dry and supple bilateral lower extremities. Negative for open lesions or macerations.  Foreign body granulomas with some drainage appear to be developing around the intact sutures that are nonabsorbable to the digits 2-5.  No open wound no drainage.  No malodor noted.  There is some very tender hyperkeratotic callus lesions noted to the right foot x2.  Vascular: Palpable pedal pulses bilaterally. No edema or erythema noted. Capillary refill within normal limits.  Neurological: Epicritic and protective threshold grossly intact bilaterally.   Musculoskeletal Exam: Range of motion within normal limits to all pedal and ankle joints bilateral. Muscle strength 5/5 in all groups bilateral.  Tenderness to palpation along the surgical forefoot entirely.  Patient is very protective of her surgical forefoot.  Assessment: 1.  Foreign bodies right forefoot (nonabsorbable sutures 2-5 right).  Mostly unchanged since last visit 2.   Hyperkeratotic callus lesions right fifth met tubercle.  Right second toe. 3.  Capsulitis right forefoot MPJs 2-5   Plan of Care:  1. Patient evaluated.  2.  Surgery is pending due to available blocktime at the hospital.  Tentatively planned for 11/23/2018. 3.  Surgery consult and questions were answered on last visit, 10/08/2018. 4.  Return to clinic 1 week postop   Edrick Kins, DPM [facility]  Dr. Edrick Kins, DPM    [address], [place] [address]                Office [phone]  Fax [phone]

## 2018-11-05 NOTE — H&P (View-Only) (Signed)
   HPI: 47-year-old female presents the office today after having bunion surgery on 01/16/2018.  The patient never allowed the physician or office staff to remove her sutures.  She has had pain and tenderness associated with the nonabsorbable sutures in her toes ever since surgery.  She was originally scheduled for suture removal at the [facility] under IV sedation, however she had a balance due and was unable to have surgery at that location until the balance is paid.  Patient was then scheduled for surgery at the [facility], however it is been rescheduled due to different documented circumstances.  She is concerned today for infection of the toes where the sutures are  Past Medical History:  Diagnosis Date  . Abdominal pain   . Fibromyalgia   . GERD (gastroesophageal reflux disease)   . Heart murmur   . Migraines    migraines     Physical Exam: General: The patient is alert and oriented x3 in no acute distress.  Dermatology: Skin is warm, dry and supple bilateral lower extremities. Negative for open lesions or macerations.  Foreign body granulomas with some drainage appear to be developing around the intact sutures that are nonabsorbable to the digits 2-5.  No open wound no drainage.  No malodor noted.  There is some very tender hyperkeratotic callus lesions noted to the right foot x2.  Vascular: Palpable pedal pulses bilaterally. No edema or erythema noted. Capillary refill within normal limits.  Neurological: Epicritic and protective threshold grossly intact bilaterally.   Musculoskeletal Exam: Range of motion within normal limits to all pedal and ankle joints bilateral. Muscle strength 5/5 in all groups bilateral.  Tenderness to palpation along the surgical forefoot entirely.  Patient is very protective of her surgical forefoot.  Assessment: 1.  Foreign bodies right forefoot (nonabsorbable sutures 2-5 right).  Mostly unchanged since last visit 2.   Hyperkeratotic callus lesions right fifth met tubercle.  Right second toe. 3.  Capsulitis right forefoot MPJs 2-5   Plan of Care:  1. Patient evaluated.  2.  Surgery is pending due to available blocktime at the hospital.  Tentatively planned for 11/23/2018. 3.  Surgery consult and questions were answered on last visit, 10/08/2018. 4.  Return to clinic 1 week postop   Brent M. Evans, DPM [facility]  Dr. Brent M. Evans, DPM    [address]                                        [place], [place] [address]                Office [phone]  Fax [phone]     

## 2018-11-06 NOTE — Telephone Encounter (Signed)
I am calling to let you know that we have you scheduled for surgery on October 30 at [facility].  "What time am I supposed to get there?"   Your surgery will start at 2 pm.  You will probably need to be there a couple of hours before that time.  You'll get a call from the pre-op nurse.  She'll give you your exact arrival time.  You will also need a Covid test done prior to having your surgery.  I do that every week on Mondays for my job.  Do I need to bring the results to you all?"  Someone from pre-testing will give you a call.  Let them know and they will let you know how to proceed.  Have you had a physical completed within 30 days of your surgery date?  "Yes, do you have a copy of it with my papers?"  That form will be expired by October 30.  "Why is that?"  You had it completed on October 16, 2018.  It has to be within 30 days of your surgery date.  "So I have to see my doctor again and pay another $50?"  Yes, that is correct.  "I'll get in to see her sometime this week."

## 2018-11-09 ENCOUNTER — Telehealth: Payer: Self-pay | Admitting: *Deleted

## 2018-11-09 NOTE — Telephone Encounter (Signed)
DOS 11/23/2018 EXCISION FOREIGN BODY - COMPLICATED - 0000000, EXCISION BENIGN LESION X 2 - 11421, MULTIPLE LESION AND STEROID INJECTION - 20550 OF THE RIGHT FOOT  BCBS: Eligibility Date - 01/24/2018 - 01/23/9998   In-Network    Max Per Benefit Period Year-to-Date Remaining  CoInsurance  20%   Deductible  $2500.00 $2500.00  Out-Of-Pocket  $5000.00 $4476.73   AMBULATORY SURGERY  In Network  [employer] Coinsurance  Not Applicable  [id number] per [place]

## 2018-11-12 DIAGNOSIS — Z20828 Contact with and (suspected) exposure to other viral communicable diseases: Secondary | ICD-10-CM | POA: Diagnosis not present

## 2018-11-15 ENCOUNTER — Telehealth: Payer: Self-pay | Admitting: *Deleted

## 2018-11-15 NOTE — Telephone Encounter (Signed)
"  I'm scheduled to have surgery on October 30.  I had a history and physical form done previously but they told me it was not good anymore because it had expired.  How do I go about getting those forms again?"  I can fax them to you or you can come by to pick them up.  "I'll come by tomorrow to pick them up.  Also, I take a Covid test every week at my job.  Will it be okay for me to use those test results?"  The test is usually done three to four days prior to your surgery date.  You will get a call from someone from pre-testing to schedule that appointment.  "How do I go about giving them my job results?"  I am not sure about there protocol.  You need to ask them.  "They haven't called me yet."  I can give you their phone number.  I'll put it with your paperwork.  "Okay, I'll pick it up tomorrow."  I put the forms at the front desk.

## 2018-11-19 DIAGNOSIS — Z20828 Contact with and (suspected) exposure to other viral communicable diseases: Secondary | ICD-10-CM | POA: Diagnosis not present

## 2018-11-20 ENCOUNTER — Telehealth: Payer: Self-pay | Admitting: *Deleted

## 2018-11-20 ENCOUNTER — Other Ambulatory Visit (HOSPITAL_COMMUNITY)
Admission: RE | Admit: 2018-11-20 | Discharge: 2018-11-20 | Disposition: A | Discharge status: Home / Self Care | Payer: BC Managed Care – PPO | Source: Ambulatory Visit | Attending: Podiatry | Admitting: Podiatry

## 2018-11-20 DIAGNOSIS — Z01812 Encounter for preprocedural laboratory examination: Secondary | ICD-10-CM | POA: Diagnosis not present

## 2018-11-20 DIAGNOSIS — Z20828 Contact with and (suspected) exposure to other viral communicable diseases: Secondary | ICD-10-CM | POA: Diagnosis not present

## 2018-11-20 NOTE — Telephone Encounter (Signed)
Orders placed.

## 2018-11-20 NOTE — Telephone Encounter (Signed)
"  I am calling to request orders in Epic and a H&P on Ms. Julie Valentine.  She's scheduled for surgery on Friday with Dr. Amalia Hailey.  If you have any questions please call back."

## 2018-11-21 LAB — NOVEL CORONAVIRUS, NAA (HOSP ORDER, SEND-OUT TO REF LAB; TAT 18-24 HRS): SARS-CoV-2, NAA: NOT DETECTED

## 2018-11-22 ENCOUNTER — Encounter (HOSPITAL_BASED_OUTPATIENT_CLINIC_OR_DEPARTMENT_OTHER): Payer: Self-pay | Admitting: *Deleted

## 2018-11-22 ENCOUNTER — Other Ambulatory Visit: Payer: Self-pay

## 2018-11-22 NOTE — Progress Notes (Addendum)
Spoke w/ via phone for pre-op interview--- PT Lab needs dos----  Urine preg COVID test ------ 11-20-2018 Arrive at ------- 1200 NPO after ------ MN w/ exception clear liquids until 0800 then nothing by mouth (no cream/ milk products) Medications to take morning of surgery ----- Xanax, Prilosec w/ sips of water Diabetic medication ----- n/a Patient Special Instructions ----- n/a Pre-Op special Istructions ----- pt's pcp, dr Lin Landsman, h&p dated 11-20-2018 received via fax from dr Amalia Hailey office, in chat. Patient verbalized understanding of instructions that were given at this phone interview. Patient denies shortness of breath, chest pain, fever, cough a this phone interview.

## 2018-11-23 ENCOUNTER — Encounter (HOSPITAL_BASED_OUTPATIENT_CLINIC_OR_DEPARTMENT_OTHER): Payer: Self-pay | Admitting: *Deleted

## 2018-11-23 ENCOUNTER — Ambulatory Visit (HOSPITAL_BASED_OUTPATIENT_CLINIC_OR_DEPARTMENT_OTHER): Payer: BC Managed Care – PPO | Admitting: Anesthesiology

## 2018-11-23 ENCOUNTER — Encounter (HOSPITAL_BASED_OUTPATIENT_CLINIC_OR_DEPARTMENT_OTHER)
Admission: RE | Disposition: A | Discharge status: Home / Self Care | Payer: Self-pay | Source: Home / Self Care | Attending: Podiatry

## 2018-11-23 ENCOUNTER — Ambulatory Visit (HOSPITAL_BASED_OUTPATIENT_CLINIC_OR_DEPARTMENT_OTHER)
Admission: RE | Admit: 2018-11-23 | Discharge: 2018-11-23 | Disposition: A | Discharge status: Home / Self Care | Payer: BC Managed Care – PPO | Attending: Podiatry | Admitting: Podiatry

## 2018-11-23 ENCOUNTER — Other Ambulatory Visit: Payer: Self-pay

## 2018-11-23 DIAGNOSIS — S90851A Superficial foreign body, right foot, initial encounter: Secondary | ICD-10-CM | POA: Diagnosis not present

## 2018-11-23 DIAGNOSIS — L02611 Cutaneous abscess of right foot: Secondary | ICD-10-CM | POA: Diagnosis not present

## 2018-11-23 DIAGNOSIS — K219 Gastro-esophageal reflux disease without esophagitis: Secondary | ICD-10-CM | POA: Insufficient documentation

## 2018-11-23 DIAGNOSIS — F419 Anxiety disorder, unspecified: Secondary | ICD-10-CM | POA: Diagnosis not present

## 2018-11-23 DIAGNOSIS — F17200 Nicotine dependence, unspecified, uncomplicated: Secondary | ICD-10-CM | POA: Diagnosis not present

## 2018-11-23 DIAGNOSIS — L923 Foreign body granuloma of the skin and subcutaneous tissue: Secondary | ICD-10-CM | POA: Diagnosis not present

## 2018-11-23 DIAGNOSIS — G43909 Migraine, unspecified, not intractable, without status migrainosus: Secondary | ICD-10-CM | POA: Diagnosis not present

## 2018-11-23 DIAGNOSIS — M795 Residual foreign body in soft tissue: Secondary | ICD-10-CM | POA: Insufficient documentation

## 2018-11-23 DIAGNOSIS — L84 Corns and callosities: Secondary | ICD-10-CM | POA: Diagnosis not present

## 2018-11-23 DIAGNOSIS — Z79899 Other long term (current) drug therapy: Secondary | ICD-10-CM | POA: Diagnosis not present

## 2018-11-23 DIAGNOSIS — L989 Disorder of the skin and subcutaneous tissue, unspecified: Secondary | ICD-10-CM | POA: Diagnosis not present

## 2018-11-23 DIAGNOSIS — M7751 Other enthesopathy of right foot: Secondary | ICD-10-CM | POA: Diagnosis not present

## 2018-11-23 DIAGNOSIS — D2371 Other benign neoplasm of skin of right lower limb, including hip: Secondary | ICD-10-CM | POA: Diagnosis not present

## 2018-11-23 HISTORY — PX: SUTURE REMOVAL: SHX6354

## 2018-11-23 HISTORY — DX: Other specified postprocedural states: Z98.890

## 2018-11-23 HISTORY — PX: STERIOD INJECTION: SHX5046

## 2018-11-23 HISTORY — PX: FOREIGN BODY REMOVAL: SHX962

## 2018-11-23 HISTORY — PX: LESION REMOVAL: SHX5196

## 2018-11-23 HISTORY — DX: Complete loss of teeth, unspecified cause, unspecified class: K08.109

## 2018-11-23 HISTORY — DX: Complete loss of teeth, unspecified cause, unspecified class: Z97.2

## 2018-11-23 HISTORY — DX: Personal history of other benign neoplasm: Z86.018

## 2018-11-23 HISTORY — DX: Generalized anxiety disorder: F41.1

## 2018-11-23 LAB — POCT PREGNANCY, URINE: Preg Test, Ur: NEGATIVE

## 2018-11-23 SURGERY — REMOVAL FOREIGN BODY EXTREMITY
Anesthesia: Monitor Anesthesia Care | Laterality: Right

## 2018-11-23 MED ORDER — CHLORHEXIDINE GLUCONATE 4 % EX LIQD
60.0000 mL | Freq: Once | CUTANEOUS | Status: DC
  Filled 2018-11-23: qty 118

## 2018-11-23 MED ORDER — LIDOCAINE 2% (20 MG/ML) 5 ML SYRINGE
INTRAMUSCULAR | Status: AC
  Filled 2018-11-23: qty 5

## 2018-11-23 MED ORDER — PROPOFOL 500 MG/50ML IV EMUL
INTRAVENOUS | Status: DC | PRN
  Administered 2018-11-23: 150 ug/kg/min via INTRAVENOUS

## 2018-11-23 MED ORDER — OXYCODONE-ACETAMINOPHEN 5-325 MG PO TABS
ORAL_TABLET | ORAL | Status: AC
  Filled 2018-11-23: qty 1

## 2018-11-23 MED ORDER — PROMETHAZINE HCL 25 MG/ML IJ SOLN
6.2500 mg | INTRAMUSCULAR | Status: DC | PRN
  Filled 2018-11-23: qty 1

## 2018-11-23 MED ORDER — OXYCODONE HCL 5 MG PO TABS
5.0000 mg | ORAL_TABLET | Freq: Once | ORAL | Status: DC | PRN
  Filled 2018-11-23: qty 1

## 2018-11-23 MED ORDER — MIDAZOLAM HCL 2 MG/2ML IJ SOLN
INTRAMUSCULAR | Status: AC
  Filled 2018-11-23: qty 2

## 2018-11-23 MED ORDER — FENTANYL CITRATE (PF) 100 MCG/2ML IJ SOLN
INTRAMUSCULAR | Status: DC | PRN
  Administered 2018-11-23 (×2): 50 ug via INTRAVENOUS

## 2018-11-23 MED ORDER — OXYCODONE-ACETAMINOPHEN 5-325 MG PO TABS
1.0000 | ORAL_TABLET | Freq: Once | ORAL | Status: AC
  Administered 2018-11-23: 1 via ORAL
  Filled 2018-11-23: qty 1

## 2018-11-23 MED ORDER — OXYCODONE HCL 5 MG/5ML PO SOLN
5.0000 mg | Freq: Once | ORAL | Status: DC | PRN
  Filled 2018-11-23: qty 5

## 2018-11-23 MED ORDER — LIDOCAINE HCL 2 % IJ SOLN
INTRAMUSCULAR | Status: DC | PRN
  Administered 2018-11-23: 5 mL

## 2018-11-23 MED ORDER — ACETAMINOPHEN 10 MG/ML IV SOLN
1000.0000 mg | Freq: Once | INTRAVENOUS | Status: DC | PRN
  Filled 2018-11-23: qty 100

## 2018-11-23 MED ORDER — MIDAZOLAM HCL 2 MG/2ML IJ SOLN
INTRAMUSCULAR | Status: DC | PRN
  Administered 2018-11-23 (×2): 1 mg via INTRAVENOUS

## 2018-11-23 MED ORDER — FENTANYL CITRATE (PF) 100 MCG/2ML IJ SOLN
INTRAMUSCULAR | Status: AC
  Filled 2018-11-23: qty 2

## 2018-11-23 MED ORDER — PROPOFOL 10 MG/ML IV BOLUS
INTRAVENOUS | Status: AC
  Filled 2018-11-23: qty 20

## 2018-11-23 MED ORDER — LIDOCAINE 2% (20 MG/ML) 5 ML SYRINGE
INTRAMUSCULAR | Status: DC | PRN
  Administered 2018-11-23: 50 mg via INTRAVENOUS

## 2018-11-23 MED ORDER — LACTATED RINGERS IV SOLN
INTRAVENOUS | Status: DC
  Administered 2018-11-23: 14:00:00 via INTRAVENOUS
  Filled 2018-11-23: qty 1000

## 2018-11-23 MED ORDER — KETOROLAC TROMETHAMINE 30 MG/ML IJ SOLN
INTRAMUSCULAR | Status: AC
  Filled 2018-11-23: qty 1

## 2018-11-23 MED ORDER — TRIAMCINOLONE ACETONIDE 40 MG/ML IJ SUSP
INTRAMUSCULAR | Status: DC | PRN
  Administered 2018-11-23: 1 mL

## 2018-11-23 MED ORDER — SODIUM CHLORIDE 0.9 % IR SOLN
Status: DC | PRN
  Administered 2018-11-23: 1000 mL

## 2018-11-23 MED ORDER — PROPOFOL 10 MG/ML IV BOLUS
INTRAVENOUS | Status: DC | PRN
  Administered 2018-11-23: 50 mg via INTRAVENOUS

## 2018-11-23 MED ORDER — HYDROMORPHONE HCL 1 MG/ML IJ SOLN
0.2500 mg | INTRAMUSCULAR | Status: DC | PRN
  Administered 2018-11-23 (×4): 0.25 mg via INTRAVENOUS
  Filled 2018-11-23: qty 0.5

## 2018-11-23 MED ORDER — OXYCODONE-ACETAMINOPHEN 5-325 MG PO TABS: 1.0000 | ORAL_TABLET | Freq: Four times a day (QID) | ORAL | 0 refills | Status: DC | PRN

## 2018-11-23 MED ORDER — TOBRAMYCIN SULFATE 1.2 G IJ SOLR
1.2000 g | INTRAMUSCULAR | Status: DC
  Filled 2018-11-23 (×2): qty 1.2

## 2018-11-23 MED ORDER — CEFAZOLIN SODIUM-DEXTROSE 2-4 GM/100ML-% IV SOLN
INTRAVENOUS | Status: AC
  Filled 2018-11-23: qty 100

## 2018-11-23 MED ORDER — HYDROMORPHONE HCL 1 MG/ML IJ SOLN
INTRAMUSCULAR | Status: AC
  Filled 2018-11-23: qty 1

## 2018-11-23 MED ORDER — PROPOFOL 500 MG/50ML IV EMUL
INTRAVENOUS | Status: AC
  Filled 2018-11-23: qty 50

## 2018-11-23 MED ORDER — CEFAZOLIN SODIUM-DEXTROSE 2-4 GM/100ML-% IV SOLN
2.0000 g | INTRAVENOUS | Status: AC
  Administered 2018-11-23: 2 g via INTRAVENOUS
  Filled 2018-11-23: qty 100

## 2018-11-23 MED ORDER — KETOROLAC TROMETHAMINE 30 MG/ML IJ SOLN
30.0000 mg | Freq: Once | INTRAMUSCULAR | Status: AC
  Administered 2018-11-23: 30 mg via INTRAVENOUS
  Filled 2018-11-23: qty 1

## 2018-11-23 SURGICAL SUPPLY — 44 items
BANDAGE ACE 4X5 VEL STRL LF (GAUZE/BANDAGES/DRESSINGS) IMPLANT
BLADE HEX COATED 2.75 (ELECTRODE) ×1 IMPLANT
BLADE SAW SGTL 83.5X18.5 (BLADE) ×2 IMPLANT
BLADE SURG 15 STRL LF DISP TIS (BLADE) ×3 IMPLANT
BLADE SURG 15 STRL SS (BLADE) ×3
BNDG COHESIVE 6X5 TAN STRL LF (GAUZE/BANDAGES/DRESSINGS) ×1 IMPLANT
BNDG CONFORM 3 STRL LF (GAUZE/BANDAGES/DRESSINGS) ×1 IMPLANT
BNDG ELASTIC 4X5.8 VLCR STR LF (GAUZE/BANDAGES/DRESSINGS) ×1 IMPLANT
BNDG ESMARK 4X9 LF (GAUZE/BANDAGES/DRESSINGS) ×2 IMPLANT
BNDG GAUZE ELAST 4 BULKY (GAUZE/BANDAGES/DRESSINGS) ×3 IMPLANT
CHLORAPREP W/TINT 26ML (MISCELLANEOUS) ×2 IMPLANT
COVER SURGICAL LIGHT HANDLE (MISCELLANEOUS) ×2 IMPLANT
COVER WAND RF STERILE (DRAPES) ×2 IMPLANT
CUFF TOURN SGL QUICK 18X4 (TOURNIQUET CUFF) ×2 IMPLANT
CUFF TOURN SGL QUICK 34 (TOURNIQUET CUFF)
CUFF TRNQT CYL 34X4.125X (TOURNIQUET CUFF) ×1 IMPLANT
DRAPE U-SHAPE 47X51 STRL (DRAPES) ×2 IMPLANT
ELECT REM PT RETURN 9FT ADLT (ELECTROSURGICAL)
ELECTRODE REM PT RTRN 9FT ADLT (ELECTROSURGICAL) IMPLANT
GAUZE SPONGE 4X4 12PLY STRL (GAUZE/BANDAGES/DRESSINGS) ×2 IMPLANT
GAUZE SPONGE 4X4 12PLY STRL LF (GAUZE/BANDAGES/DRESSINGS) ×1 IMPLANT
GAUZE XEROFORM 1X8 LF (GAUZE/BANDAGES/DRESSINGS) ×2 IMPLANT
GLOVE BIO SURGEON STRL SZ8 (GLOVE) ×2 IMPLANT
GLOVE BIOGEL PI IND STRL 8 (GLOVE) ×1 IMPLANT
GLOVE BIOGEL PI INDICATOR 8 (GLOVE) ×1
GOWN STRL REUS W/ TWL LRG LVL3 (GOWN DISPOSABLE) ×2 IMPLANT
GOWN STRL REUS W/TWL LRG LVL3 (GOWN DISPOSABLE) ×2
KIT TURNOVER CYSTO (KITS) ×2 IMPLANT
NDL HYPO 25X1 1.5 SAFETY (NEEDLE) ×1 IMPLANT
NEEDLE HYPO 25X1 1.5 SAFETY (NEEDLE) ×2 IMPLANT
NS IRRIG 1000ML POUR BTL (IV SOLUTION) ×2 IMPLANT
PACK BASIN DAY SURGERY FS (CUSTOM PROCEDURE TRAY) ×2 IMPLANT
PACK ORTHO EXTREMITY (CUSTOM PROCEDURE TRAY) ×2 IMPLANT
PAD ARMBOARD 7.5X6 YLW CONV (MISCELLANEOUS) ×4 IMPLANT
PADDING CAST COTTON 6X4 STRL (CAST SUPPLIES) ×2 IMPLANT
SCRUB BETADINE 4OZ XXX (MISCELLANEOUS) ×2 IMPLANT
STOCKINETTE IMPERVIOUS 9X36 MD (GAUZE/BANDAGES/DRESSINGS) ×2 IMPLANT
SUT PROLENE 3 0 PS 2 (SUTURE) ×1 IMPLANT
SWAB CULTURE LIQ STUART DBL (MISCELLANEOUS) ×1 IMPLANT
SWAB CULTURE LIQUID MINI MALE (MISCELLANEOUS) ×1 IMPLANT
SYR CONTROL 10ML LL (SYRINGE) ×2 IMPLANT
TOWEL OR 17X26 10 PK STRL BLUE (TOWEL DISPOSABLE) ×4 IMPLANT
TUBE CONNECTING 12X1/4 (SUCTIONS) ×2 IMPLANT
YANKAUER SUCT BULB TIP NO VENT (SUCTIONS) ×2 IMPLANT

## 2018-11-23 NOTE — Discharge Instructions (Signed)
Regional Anesthesia Blocks  1. Numbness or the inability to move the "blocked" extremity may last from 3-48 hours after placement. The length of time depends on the medication injected and your individual response to the medication. If the numbness is not going away after 48 hours, call your surgeon.  2. The extremity that is blocked will need to be protected until the numbness is gone and the  Strength has returned. Because you cannot feel it, you will need to take extra care to avoid injury. Because it may be weak, you may have difficulty moving it or using it. You may not know what position it is in without looking at it while the block is in effect.  3. For blocks in the legs and feet, returning to weight bearing and walking needs to be done carefully. You will need to wait until the numbness is entirely gone and the strength has returned. You should be able to move your leg and foot normally before you try and bear weight or walk. You will need someone to be with you when you first try to ensure you do not fall and possibly risk injury.  4. Bruising and tenderness at the needle site are common side effects and will resolve in a few days.  5. Persistent numbness or new problems with movement should be communicated to the surgeon or the [place] [phone] [place] [phone]).- Dressings clean dry and intact x 1 week. Elevate foot. Weight bearing as  Tolerated.   Call your surgeon if you experience:   1.  Fever over 101.0. 2.  Inability to urinate. 3.  Nausea and/or vomiting. 4.  Extreme swelling or bruising at the surgical site. 5.  Continued bleeding from the incision. 6.  Increased pain, redness or drainage from the incision. 7.  Problems related to your pain medication. 8.  Any problems and/or concerns     Do not take any nonsteroidal antiinflammatories until after 9:15 pm today  Post Anesthesia Home Care Instructions  Activity: Get plenty  of rest for the remainder of the day. A responsible individual must stay with you for 24 hours following the procedure.  For the next 24 hours, DO NOT: -Drive a car -Paediatric nurse -Drink alcoholic beverages -Take any medication unless instructed by your physician -Make any legal decisions or sign important papers.  Meals: Start with liquid foods such as gelatin or soup. Progress to regular foods as tolerated. Avoid greasy, spicy, heavy foods. If nausea and/or vomiting occur, drink only clear liquids until the nausea and/or vomiting subsides. Call your physician if vomiting continues.  Special Instructions/Symptoms: Your throat may feel dry or sore from the anesthesia or the breathing tube placed in your throat during surgery. If this causes discomfort, gargle with warm salt water. The discomfort should disappear within 24 hours.  Marland Kitchen

## 2018-11-23 NOTE — Interval H&P Note (Signed)
History and Physical Interval Note:  11/23/2018 2:50 PM  Julie Valentine  has presented today for surgery, with the diagnosis of RESIDUAL FOREIGN BODY AND SOFT TISSUE, BENIGN SKIN LESION.  The various methods of treatment have been discussed with the patient and family. After consideration of risks, benefits and other options for treatment, the patient has consented to  Procedure(s): REMOVAL FOREIGN BODY RIGHT FOOT (Right) EXCISION OF BENIGN LESION 1.9CM (Right) SUTURE REMOVAL TIMES FOUR (Right) STEROID INJECTION (Right) as a surgical intervention.  The patient's history has been reviewed, patient examined, no change in status, stable for surgery.  I have reviewed the patient's chart and labs.  Questions were answered to the patient's satisfaction.     Edrick Kins

## 2018-11-23 NOTE — Progress Notes (Signed)
Next dose of oxycodone, 10:15 pm written on discharge instructions

## 2018-11-23 NOTE — Anesthesia Postprocedure Evaluation (Signed)
Anesthesia Post Note  Patient: Julie Valentine  Procedure(s) Performed: REMOVAL FOREIGN BODY RIGHT FOOT (Right ) EXCISION OF BENIGN LESION 1.9CM (Right ) SUTURE REMOVAL TIMES FOUR (Right ) STEROID INJECTION (Right )     Patient location during evaluation: PACU Anesthesia Type: MAC Level of consciousness: awake and alert Pain management: pain level controlled Vital Signs Assessment: post-procedure vital signs reviewed and stable Respiratory status: spontaneous breathing, nonlabored ventilation, respiratory function stable and patient connected to nasal cannula oxygen Cardiovascular status: stable and blood pressure returned to baseline Postop Assessment: no apparent nausea or vomiting Anesthetic complications: no    Last Vitals:  Vitals:   11/23/18 1603 11/23/18 1655  BP: 113/80 109/75  Pulse: (!) 59 64  Resp: 16 16  Temp:  (!) 36.4 C  SpO2: 99% 98%    Last Pain:  Vitals:   11/23/18 1655  TempSrc: Oral  PainSc: 3                  Ryan P Ellender

## 2018-11-23 NOTE — H&P (Signed)
Anesthesia H&P Update: History and Physical Exam reviewed; patient is OK for planned anesthetic and procedure. ? ?

## 2018-11-23 NOTE — Transfer of Care (Signed)
Immediate Anesthesia Transfer of Care Note  Patient: Julie Valentine  Procedure(s) Performed: Procedure(s) (LRB): REMOVAL FOREIGN BODY RIGHT FOOT (Right) EXCISION OF BENIGN LESION 1.9CM (Right) SUTURE REMOVAL TIMES FOUR (Right) STEROID INJECTION (Right)  Patient Location: PACU  Anesthesia Type: MAC  Level of Consciousness: awake, alert , oriented and patient cooperative  Airway & Oxygen Therapy: Patient Spontanous Breathing and Patient connected to face mask oxygen  Post-op Assessment: Report given to PACU RN and Post -op Vital signs reviewed and stable  Post vital signs: Reviewed and stable  Complications: No apparent anesthesia complications  Last Vitals:  Vitals Value Taken Time  BP 95/65 11/23/18 1500  Temp 36.5 C 11/23/18 1451  Pulse 63 11/23/18 1503  Resp 18 11/23/18 1503  SpO2 100 % 11/23/18 1503  Vitals shown include unvalidated device data.  Last Pain:  Vitals:   11/23/18 1500  TempSrc:   PainSc: Asleep      Patients Stated Pain Goal: 5 (11/23/18 1253)

## 2018-11-23 NOTE — Brief Op Note (Signed)
11/23/2018  2:50 PM  PATIENT:  Julie Valentine  48 y.o. female  PRE-OPERATIVE DIAGNOSIS:  RESIDUAL FOREIGN BODY AND SOFT TISSUE, BENIGN SKIN LESION  POST-OPERATIVE DIAGNOSIS:  RESIDUAL FOREIGN BODY AND SOFT TISSUE, BENIGN SKIN   PROCEDURE:  Procedure(s): REMOVAL FOREIGN BODY RIGHT FOOT (Right) EXCISION OF BENIGN LESION 1.9CM (Right) SUTURE REMOVAL TIMES FOUR (Right) STEROID INJECTION (Right)  SURGEON:  Surgeon(s) and Role:    Edrick Kins, DPM - Primary  PHYSICIAN ASSISTANT:   ASSISTANTS: none   ANESTHESIA:   local and MAC  EBL:  0 mL   BLOOD ADMINISTERED:none  DRAINS: none   LOCAL MEDICATIONS USED:  XYLOCAINE   SPECIMEN:  No Specimen  DISPOSITION OF SPECIMEN:  N/A  COUNTS:  YES  TOURNIQUET:  * No tourniquets in log *  DICTATION: .Dragon Dictation  PLAN OF CARE: Discharge to home after PACU  PATIENT DISPOSITION:  PACU - hemodynamically stable.   Delay start of Pharmacological VTE agent (>24hrs) due to surgical blood loss or risk of bleeding: not applicable

## 2018-11-23 NOTE — Anesthesia Preprocedure Evaluation (Addendum)
Anesthesia Evaluation  Patient identified by MRN, date of birth, ID band Patient awake    Reviewed: Allergy & Precautions, NPO status , Patient's Chart, lab work & pertinent test results  History of Anesthesia Complications Negative for: history of anesthetic complications  Airway Mallampati: II  TM Distance: >3 FB Neck ROM: Full    Dental no notable dental hx.    Pulmonary Current Smoker and Patient abstained from smoking.,    Pulmonary exam normal breath sounds clear to auscultation       Cardiovascular negative cardio ROS Normal cardiovascular exam Rhythm:Regular Rate:Normal     Neuro/Psych  Headaches, PSYCHIATRIC DISORDERS Anxiety  Neuromuscular disease    GI/Hepatic Neg liver ROS, GERD  Controlled and Medicated,  Endo/Other  negative endocrine ROS  Renal/GU negative Renal ROS     Musculoskeletal  (+) Fibromyalgia -  Abdominal (+) + obese,   Peds  Hematology negative hematology ROS (+)   Anesthesia Other Findings RESIDUAL FOREIGN BODY AND SOFT TISSUE, BENIGN SKIN LESION  Reproductive/Obstetrics S/p BTL                            Anesthesia Physical Anesthesia Plan  ASA: II  Anesthesia Plan: MAC   Post-op Pain Management:    Induction: Intravenous  PONV Risk Score and Plan: 2 and Treatment may vary due to age or medical condition, Ondansetron, Dexamethasone and Midazolam  Airway Management Planned: Simple Face Mask  Additional Equipment:   Intra-op Plan:   Post-operative Plan:   Informed Consent: I have reviewed the patients History and Physical, chart, labs and discussed the procedure including the risks, benefits and alternatives for the proposed anesthesia with the patient or authorized representative who has indicated his/her understanding and acceptance.     Dental advisory given  Plan Discussed with: CRNA  Anesthesia Plan Comments:     Anesthesia Quick  Evaluation

## 2018-11-26 ENCOUNTER — Encounter (HOSPITAL_BASED_OUTPATIENT_CLINIC_OR_DEPARTMENT_OTHER): Payer: Self-pay | Admitting: Podiatry

## 2018-11-28 ENCOUNTER — Encounter: Payer: Self-pay | Admitting: Podiatry

## 2018-11-28 ENCOUNTER — Ambulatory Visit (INDEPENDENT_AMBULATORY_CARE_PROVIDER_SITE_OTHER): Payer: BC Managed Care – PPO

## 2018-11-28 ENCOUNTER — Other Ambulatory Visit: Payer: Self-pay

## 2018-11-28 ENCOUNTER — Ambulatory Visit (INDEPENDENT_AMBULATORY_CARE_PROVIDER_SITE_OTHER): Payer: Self-pay | Admitting: Podiatry

## 2018-11-28 DIAGNOSIS — Z9889 Other specified postprocedural states: Secondary | ICD-10-CM

## 2018-11-28 DIAGNOSIS — M795 Residual foreign body in soft tissue: Secondary | ICD-10-CM

## 2018-11-28 DIAGNOSIS — M79671 Pain in right foot: Secondary | ICD-10-CM

## 2018-11-29 NOTE — Op Note (Signed)
OPERATIVE REPORT Patient name: Julie Valentine MRN: [id number] DOB: Sep 02, 1970  DOS:  11/23/2018  Preop Dx: Foreign body abscess right foot.  Benign skin lesion right foot.  Toe capsulitis digits 2-5 right foot. Postop Dx: same  Procedure:  1.  Excision of foreign body abscesses right foot.  Excision of benign skin lesion right foot.  Anti-inflammatory steroid injection digits 2-5 right foot  Surgeon: Edrick Kins DPM  Anesthesia: 50-50 mixture of 2% lidocaine plain with 0.5% Marcaine plain totaling 20 mL infiltrated in the patient's right lower extremity and an ankle block fashion  Hemostasis: Ankle tourniquet inflated to a pressure of 257mmHg after esmarch exsanguination   EBL: Minimal mL Materials: None Injectables: A total of 1 mL of Kenalog 40 injected into the digits 2-5 of the right foot Pathology: None  Condition: The patient tolerated the procedure and anesthesia well. No complications noted or reported   Justification for procedure: The patient is a 48 y.o. female who presents today for surgical correction of foreign body abscesses/sutures embedded within the digits 2-5 of the right foot.  Patient also suffers from chronic toe capsulitis and inflammation as well as a painful benign skin lesion to the plantar aspect of the fifth metatarsal tubercle right foot. All conservative modalities of been unsuccessful in providing any sort of satisfactory alleviation of symptoms with the patient. The patient was told benefits as well as possible side effects of the surgery. The patient consented for surgical correction. The patient consent form was reviewed. All patient questions were answered. No guarantees were expressed or implied. The patient and the surgeon boson the patient consent form with the witness present and placed in the patient's chart.   Procedure in Detail: The patient was brought to the operating room, placed in the operating table in the supine position at which  time an aseptic scrub and drape were performed about the patient's respective lower extremity after anesthesia was induced as described above. Attention was then directed to the surgical area where procedure number one commenced.  Procedure #1: Excision of foreign body abscesses right foot Attention was directed to the digits 2-5 of the right foot where all foreign bodies/sutures embedded within the dorsal aspect of the toes were removed in toto using a combination of a rongeur as well as a surgical #15 blade.  All foreign body abscesses were removed in toto and any hypertrophic portion of tissue was also removed in toto.  No deep incisions were performed and no primary closure was necessary after removal of all sutures that have been embedded within the patient's toes.  Procedure #2: Excision of benign skin lesion right foot The hypertrophic callus noted to the plantar aspect of the right foot was excised using a surgical #15 blade.  Minimal bleeding was noted.  All hypertrophic portions of the epidermis and dermis were removed in toto.  The lesion measured approximately 1 cm in diameter.  Procedure #3: Anti-inflammatory steroid injection digits 2-5 right foot A small amount of anti-inflammatory steroid injection to a total of 1 mL of Kenalog 40 in combination with 2% lidocaine plain was utilized for anti-inflammatory injections along with digits 2-5 of the right foot.  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  Approximately 0.25 mL of Kenalog 40 was utilized in each digit  Dry sterile compressive dressings were then applied to all previously mentioned incision sites about the patient's lower extremity. The tourniquet which was used for hemostasis was deflated. All normal neurovascular responses  including pink color and warmth returned all the digits of patient's lower extremity.  The patient was then transferred from the operating room to the recovery room having tolerated the  procedure and anesthesia well. All vital signs are stable. After a brief stay in the recovery room the patient was discharged with adequate prescriptions for analgesia. Verbal as well as written instructions were provided for the patient regarding wound care. The patient is to keep the dressings clean dry and intact until they are to follow surgeon Dr. Daylene Katayama in the office upon discharge.   Edrick Kins, DPM [facility]  Dr. Edrick Kins, [address]                                        [place], [place] [address]                Office [phone]  Fax [phone]

## 2018-11-30 ENCOUNTER — Encounter: Payer: Self-pay | Admitting: Podiatry

## 2018-11-30 ENCOUNTER — Other Ambulatory Visit: Payer: Self-pay | Admitting: Podiatry

## 2018-11-30 DIAGNOSIS — M795 Residual foreign body in soft tissue: Secondary | ICD-10-CM

## 2018-12-03 DIAGNOSIS — Z20828 Contact with and (suspected) exposure to other viral communicable diseases: Secondary | ICD-10-CM | POA: Diagnosis not present

## 2018-12-03 NOTE — Progress Notes (Signed)
   Subjective:  Patient presents today status post removal of sutures / foreign bodies right foot. DOS: 11/23/2018. She reports hitting the medial aspect of the foot on the wall which caused increased pain temporarily. She has been using the CAM boot as directed. Patient is here for further evaluation and treatment.    Past Medical History:  Diagnosis Date  . Fibromyalgia   . Full dentures   . GAD (generalized anxiety disorder)   . GERD (gastroesophageal reflux disease)   . 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 Migraines       Objective/Physical Exam Neurovascular status intact.  Skin incisions appear to be well coapted with sutures and staples intact. No sign of infectious process noted. No dehiscence. No active bleeding noted. Moderate edema noted to the surgical extremity.  Radiographic Exam:  Orthopedic hardware and osteotomies sites appear to be stable with routine healing.  Assessment: 1. s/p removal of sutures / foreign bodies right foot. DOS: 11/23/2018   Plan of Care:  1. Patient was evaluated. X-rays reviewed 2. Dressing changed.  3. Recommended Silvadene cream daily.  4. Continue using CAM boot.  5. Return to clinic in 4 weeks.    Edrick Kins, DPM [facility]  Dr. Edrick Kins, [address]                                        [place], [place] [address]                Office [phone]  Fax [phone]

## 2018-12-05 ENCOUNTER — Encounter: Payer: BC Managed Care – PPO | Admitting: Podiatry

## 2018-12-26 ENCOUNTER — Encounter: Payer: BC Managed Care – PPO | Admitting: Podiatry

## 2019-01-21 ENCOUNTER — Encounter: Payer: BC Managed Care – PPO | Admitting: Podiatry

## 2019-01-21 NOTE — Progress Notes (Deleted)
Called patient due to missed Post Op appt today (01/21/2019) scheduled for 10:15am; pt stated, "wasn't aware of appt but doing fine"; I asked patient if any pain, or if bandages were still in tact; pt stated, "everything is still wrapped, no excessive bleeding or pain, doing well". Informed patient to call back and reschedule for next post op appt due to her having to work this afternoon.

## 2019-04-17 ENCOUNTER — Telehealth: Payer: Self-pay | Admitting: Podiatry

## 2019-04-17 NOTE — Telephone Encounter (Signed)
I'm calling to see how I go about getting a copy of my medical records. I told the pt we could fax, e-mail the form to her or she could come by the office. Pt requested I e-mail the form to her at [email or web address]. Asked once I received it back how long it would take for her to receive her records. I told her since I do medical records for all three offices and also do surgery scheduling it could take up to 4-5 business days.

## 2019-05-03 ENCOUNTER — Telehealth: Payer: Self-pay | Admitting: Podiatry

## 2019-05-03 NOTE — Telephone Encounter (Signed)
Left voicemail for pt regarding her signed medical records release form. Was told she had questions regarding the x-ray portion of her records. I stated I also had questions for her as to how she wanted to obtain the medical records and if she wanted all of her medical records or just for a certain period of time. Asked pt to call me back directly at her earliest convenience.

## 2019-05-08 NOTE — Telephone Encounter (Signed)
Called pt to let her know that her medical records are ready and at the front desk for her to pick up at her convenience.

## 2019-07-03 ENCOUNTER — Encounter: Payer: Self-pay | Admitting: Podiatry

## 2019-07-03 NOTE — Progress Notes (Signed)
Medical records requested by Casimer Lanius, [facility] were placed to be mailed out either today, Wednesday, 06-09 or tomorrow, Thursday, 06-10.

## 2020-03-26 ENCOUNTER — Encounter: Payer: Self-pay | Admitting: Emergency Medicine

## 2020-03-26 ENCOUNTER — Ambulatory Visit
Admission: EM | Admit: 2020-03-26 | Discharge: 2020-03-26 | Disposition: A | Discharge status: Home / Self Care | Payer: BC Managed Care – PPO | Attending: Family Medicine | Admitting: Family Medicine

## 2020-03-26 ENCOUNTER — Other Ambulatory Visit: Payer: Self-pay

## 2020-03-26 DIAGNOSIS — H01001 Unspecified blepharitis right upper eyelid: Secondary | ICD-10-CM

## 2020-03-26 DIAGNOSIS — R519 Headache, unspecified: Secondary | ICD-10-CM

## 2020-03-26 MED ORDER — ERYTHROMYCIN 5 MG/GM OP OINT: TOPICAL_OINTMENT | OPHTHALMIC | 0 refills | Status: DC

## 2020-03-26 MED ORDER — CEPHALEXIN 500 MG PO CAPS: 500.0000 mg | ORAL_CAPSULE | Freq: Two times a day (BID) | ORAL | 0 refills | Status: DC

## 2020-03-26 MED ORDER — FLUCONAZOLE 150 MG PO TABS: 150.0000 mg | ORAL_TABLET | Freq: Once | ORAL | 0 refills | Status: AC

## 2020-03-26 MED ORDER — DEXAMETHASONE SODIUM PHOSPHATE 10 MG/ML IJ SOLN
10.0000 mg | Freq: Once | INTRAMUSCULAR | Status: AC
  Administered 2020-03-26: 10 mg via INTRAMUSCULAR

## 2020-03-26 NOTE — ED Provider Notes (Signed)
[facility]    CSN: [id number] Arrival date & time: 03/26/20  1541      History   Chief Complaint Chief Complaint  Patient presents with  . Eye Problem    HPI Julie Valentine is a 50 y.o. female.   Presenting today with 3-day history of progressively worsening right upper eyelid tenderness, swelling, redness that is now spreading down to below eye.  She is also having thick drainage from this eye and significant light sensitivity, headache.  Denies fever, injury to the eye, congestion, new facial products used, dizziness, syncope.  So far trying warm compresses without much relief.     Past Medical History:  Diagnosis Date  . Fibromyalgia   . Full dentures   . GAD (generalized anxiety disorder)   . GERD (gastroesophageal reflux disease)   . 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 Migraines     Patient Active Problem List   Diagnosis Date Noted  . Abdominal pain, generalized 01/08/2016  . Nausea with vomiting 01/08/2016  . Esophageal reflux 01/08/2016    Past Surgical History:  Procedure Laterality Date  . BUNIONECTOMY WITH HAMMERTOE RECONSTRUCTION Right 01/16/2018  . COLONOSCOPY WITH PROPOFOL N/A 01/08/2016   Procedure: COLONOSCOPY WITH PROPOFOL;  Surgeon: Wilford Corner, MD;  Location: [facility] ENDOSCOPY;  Service: Endoscopy;  Laterality: N/A;  . FIBEROPTIC BRONCHOSCOPY  03-30-2004   dr Arlyce Dice @[facility]    w/ biopsy left supraclavicular node (per pt benign)  . FOREIGN BODY REMOVAL Right 11/23/2018   Procedure: REMOVAL FOREIGN BODY RIGHT FOOT;  Surgeon: Edrick Kins, DPM;  Location: [place];  Service: Podiatry;  Laterality: Right;  . LESION REMOVAL Right 11/23/2018   Procedure: EXCISION OF BENIGN LESION 1.9CM;  Surgeon: Edrick Kins, DPM;  Location: [facility];  Service: Podiatry;  Laterality: Right;  . OPEN PARTIAL HEPATECTOMY    10-29-2007   @[facility]    right sided for giant hemangioma and Cholecystectomy  . STERIOD INJECTION Right 11/23/2018   Procedure: STEROID INJECTION;  Surgeon: Edrick Kins, DPM;  Location: [place];  Service: Podiatry;  Laterality: Right;  . SUTURE REMOVAL Right 11/23/2018   Procedure: SUTURE REMOVAL TIMES FOUR;  Surgeon: Edrick Kins, DPM;  Location: [place];  Service: Podiatry;  Laterality: Right;  . TUBAL LIGATION Bilateral 1995    OB History    Gravida  4   Para  4   Term  4   Preterm  0   AB  0   Living  4     SAB  0   IAB  0   Ectopic  0   Multiple  0   Live Births               Home Medications    Prior to Admission medications   Medication Sig Start Date End Date Taking? Authorizing Provider  cephALEXin (KEFLEX) 500 MG capsule Take 1 capsule (500 mg total) by mouth 2 (two) times daily. 03/26/20  Yes Volney American, PA-C  erythromycin ophthalmic ointment Place a 1/2 inch ribbon of ointment into the right lower eyelid twice daily as needed. 03/26/20  Yes Volney American, PA-C  fluconazole (DIFLUCAN) 150 MG tablet Take 1 tablet (150 mg total) by mouth once for 1 dose. 03/26/20 03/26/20 Yes Volney American, PA-C  acetaminophen (TYLENOL) 500 MG tablet Take 1,000 mg  by mouth every 6 (six) hours as needed for moderate pain.    [provider]  ALPRAZolam Duanne Moron) 0.5 MG tablet Take 0.5 mg by mouth 3 (three) times daily. 10/16/18   [provider]  omeprazole (PRILOSEC) 20 MG capsule Take 1 capsule (20 mg total) by mouth 2 (two) times daily before a meal. Patient taking differently: Take 20 mg by mouth as needed. 10/26/16   McDonald, Mia A, PA-C  oxyCODONE-acetaminophen (PERCOCET) 5-325 MG tablet Take 1 tablet by mouth every 6 (six) hours as needed for severe pain. 11/23/18   Edrick Kins, DPM  traMADol (ULTRAM) 50 MG tablet Take 50 mg by mouth 3 (three) times daily as needed.  10/16/18   [provider]  diphenhydrAMINE (BENADRYL) 25 MG tablet Take 1 tablet (25 mg total) by mouth every 6 (six) hours. Patient not taking: Reported on 06/09/2015 12/09/14 06/09/15  Jorje Guild, NP    Family History Family History  Problem Relation Age of Onset  . Hypertension Father     Social History Social History   Tobacco Use  . Smoking status: Current Every Day Smoker    Packs/day: 0.50    Years: 18.00    Pack years: 9.00    Types: Cigarettes  . Smokeless tobacco: Never Used  Vaping Use  . Vaping Use: Never used  Substance Use Topics  . Alcohol use: Yes    Comment: socially  . Drug use: Never     Allergies   Patient has no known allergies.   Review of Systems Review of Systems Per HPI Physical Exam Triage Vital Signs ED Triage Vitals  Enc Vitals Group     BP 03/26/20 1615 125/81     Pulse Rate 03/26/20 1615 82     Resp 03/26/20 1615 20     Temp 03/26/20 1615 98.5 F (36.9 C)     Temp Source 03/26/20 1615 Oral     SpO2 03/26/20 1615 99 %     Weight --      Height --      Head Circumference --      Peak Flow --      Pain Score 03/26/20 1633 7     Pain Loc --      Pain Edu? --      Excl. in [place]? --    No data found.  Updated Vital Signs BP 125/81 (BP Location: Left Arm)   Pulse 82   Temp 98.5 F (36.9 C) (Oral)   Resp 20   SpO2 99%   Visual Acuity Right Eye Distance: 20/20 Left Eye Distance: 20/25 Bilateral Distance: 20/25  Right Eye Near:   Left Eye Near:    Bilateral Near:   (notified Erin Obando, pa of results)  Physical Exam Vitals and nursing note reviewed.  Constitutional:      Appearance: Normal appearance. She is not ill-appearing.  HENT:     Head: Atraumatic.     Mouth/Throat:     Mouth: Mucous membranes are moist.     Pharynx: Oropharynx is clear.  Eyes:     General:        Right eye: Discharge present.        Left eye: No discharge.     Extraocular Movements: Extraocular movements intact.     Pupils: Pupils are equal, round,  and reactive to light.     Comments: Right conjunctiva injected Right upper eyelid diffusely edematous, erythematous, tender to palpation Minimal edema to periorbital area diffusely  Cardiovascular:     Rate and Rhythm: Normal rate and regular rhythm.     Heart sounds: Normal heart sounds.  Pulmonary:     Effort: Pulmonary effort is normal.     Breath sounds: Normal breath sounds.  Musculoskeletal:        General: Normal range of motion.     Cervical back: Normal range of motion and neck supple.  Lymphadenopathy:     Cervical: No cervical adenopathy.  Skin:    General: Skin is warm and dry.  Neurological:     General: No focal deficit present.     Mental Status: She is alert and oriented to person, place, and time.     Cranial Nerves: No cranial nerve deficit.     Sensory: No sensory deficit.     Motor: No weakness.     Gait: Gait normal.  Psychiatric:        Mood and Affect: Mood normal.        Thought Content: Thought content normal.        Judgment: Judgment normal.      UC Treatments / Results  Labs (all labs ordered are listed, but only abnormal results are displayed) Labs Reviewed - No data to display  EKG   Radiology No results found.  Procedures Procedures (including critical care time)  Medications Ordered in UC Medications  dexamethasone (DECADRON) injection 10 mg (10 mg Intramuscular Given 03/26/20 1714)    Initial Impression / Assessment and Plan / UC Course  I have reviewed the triage vital signs and the nursing notes.  Pertinent labs & imaging results that were available during my care of the patient were reviewed by me and considered in my medical decision making (see chart for details).     Worsening infection to right upper eyelid and now also associated headache.  Visual acuity, vital signs very reassuring today.  Will start oral antibiotics given progression and extent to cover for infection and erythromycin ointment more so as a comfort  measure than anything.  IM Decadron given for headache and facial edema.  Strict return precautions given for acutely worsening symptoms.  Final Clinical Impressions(s) / UC Diagnoses   Final diagnoses:  Blepharitis of right upper eyelid, unspecified type  Bad headache   Discharge Instructions   None    ED Prescriptions    Medication Sig Dispense Auth. Provider   cephALEXin (KEFLEX) 500 MG capsule Take 1 capsule (500 mg total) by mouth 2 (two) times daily. 14 capsule Volney American, [place]   erythromycin ophthalmic ointment Place a 1/2 inch ribbon of ointment into the right lower eyelid twice daily as needed. 3.5 g Volney American, PA-C   fluconazole (DIFLUCAN) 150 MG tablet Take 1 tablet (150 mg total) by mouth once for 1 dose. 1 tablet Volney American, [place]     PDMP not reviewed this encounter.   Volney American, [place] 03/26/20 1755

## 2020-03-26 NOTE — ED Triage Notes (Addendum)
Woke with right eye redness, crustiness in morning.  Initially thought to have a stye.  Now swelling is across upper lid.  Patient has otc drops, and has used them, but no improvement.  Also describes eye as dry  States vision is blurry.  States light hurts eye

## 2021-01-25 ENCOUNTER — Emergency Department (HOSPITAL_COMMUNITY)
Admission: EM | Admit: 2021-01-25 | Discharge: 2021-01-26 | Disposition: A | Discharge status: Home / Self Care | Payer: BC Managed Care – PPO | Attending: Emergency Medicine | Admitting: Emergency Medicine

## 2021-01-25 ENCOUNTER — Emergency Department (HOSPITAL_COMMUNITY): Payer: BC Managed Care – PPO

## 2021-01-25 ENCOUNTER — Ambulatory Visit
Admission: EM | Admit: 2021-01-25 | Discharge: 2021-01-25 | Disposition: A | Discharge status: Home / Self Care | Payer: BC Managed Care – PPO

## 2021-01-25 ENCOUNTER — Other Ambulatory Visit: Payer: Self-pay

## 2021-01-25 ENCOUNTER — Encounter (HOSPITAL_COMMUNITY): Payer: Self-pay | Admitting: Emergency Medicine

## 2021-01-25 DIAGNOSIS — Z79899 Other long term (current) drug therapy: Secondary | ICD-10-CM | POA: Insufficient documentation

## 2021-01-25 DIAGNOSIS — R0602 Shortness of breath: Secondary | ICD-10-CM | POA: Diagnosis not present

## 2021-01-25 DIAGNOSIS — R29810 Facial weakness: Secondary | ICD-10-CM | POA: Diagnosis not present

## 2021-01-25 DIAGNOSIS — R519 Headache, unspecified: Secondary | ICD-10-CM | POA: Diagnosis present

## 2021-01-25 HISTORY — DX: Migraine, unspecified, not intractable, without status migrainosus: G43.909

## 2021-01-25 LAB — BASIC METABOLIC PANEL
Anion gap: 9 (ref 5–15)
BUN: 13 mg/dL (ref 6–20)
CO2: 25 mmol/L (ref 22–32)
Calcium: 9.3 mg/dL (ref 8.9–10.3)
Chloride: 105 mmol/L (ref 98–111)
Creatinine, Ser: 0.79 mg/dL (ref 0.44–1.00)
GFR, Estimated: >60 mL/min (ref >60)
Glucose, Bld: 98 mg/dL (ref 70–99)
Potassium: 4.4 mmol/L (ref 3.5–5.1)
Sodium: 139 mmol/L (ref 135–145)

## 2021-01-25 LAB — CBC WITH DIFFERENTIAL/PLATELET
Abs Immature Granulocytes: 0.02 10*3/uL (ref 0.00–0.07)
Basophils Absolute: 0 10*3/uL (ref 0.0–0.1)
Basophils Relative: 1 %
Eosinophils Absolute: 0.2 10*3/uL (ref 0.0–0.5)
Eosinophils Relative: 4 %
HCT: 40.5 % (ref 36.0–46.0)
Hemoglobin: 13.9 g/dL (ref 12.0–15.0)
Immature Granulocytes: 0 %
Lymphocytes Relative: 52 %
Lymphs Abs: 2.5 10*3/uL (ref 0.7–4.0)
MCH: 33.6 pg (ref 26.0–34.0)
MCHC: 34.3 g/dL (ref 30.0–36.0)
MCV: 97.8 fL (ref 80.0–100.0)
Monocytes Absolute: 0.3 10*3/uL (ref 0.1–1.0)
Monocytes Relative: 7 %
Neutro Abs: 1.7 10*3/uL (ref 1.7–7.7)
Neutrophils Relative %: 36 %
Platelets: 293 10*3/uL (ref 150–400)
RBC: 4.14 MIL/uL (ref 3.87–5.11)
RDW: 13.7 % (ref 11.5–15.5)
WBC: 4.8 10*3/uL (ref 4.0–10.5)
nRBC: 0 % (ref 0.0–0.2)

## 2021-01-25 LAB — PROTIME-INR
INR: 1 (ref 0.8–1.2)
Prothrombin Time: 12.8 seconds (ref 11.4–15.2)

## 2021-01-25 NOTE — ED Provider Notes (Signed)
[name]    CSN: [id number] Arrival date & time: 01/25/21  1129      History   Chief Complaint Chief Complaint  Patient presents with   Migraine    HPI Julie Valentine is a 51 y.o. female.   Patient here today for evaluation of headache that seems to be worsening since onset yesterday. She does have history of migraines but states this headache is different in that she has noticed some left sided facial weakness and blurred vision at times. She reports she does have some dizziness and has had photosensitivity.   The history is provided by the patient.  Migraine Associated symptoms include headaches. Pertinent negatives include no shortness of breath.   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)   Migraines     Patient Active Problem List   Diagnosis Date Noted   Abdominal pain, generalized 01/08/2016   Nausea with vomiting 01/08/2016   Esophageal reflux 01/08/2016    Past Surgical History:  Procedure Laterality Date   BUNIONECTOMY WITH HAMMERTOE RECONSTRUCTION Right 01/16/2018   COLONOSCOPY WITH PROPOFOL N/A 01/08/2016   Procedure: COLONOSCOPY WITH PROPOFOL;  Surgeon: Wilford Corner, MD;  Location: [facility] ENDOSCOPY;  Service: Endoscopy;  Laterality: N/A;   FIBEROPTIC BRONCHOSCOPY  03-30-2004   dr Arlyce Dice @[facility]    w/ biopsy left supraclavicular node (per pt benign)   FOREIGN BODY REMOVAL Right 11/23/2018   Procedure: REMOVAL FOREIGN BODY RIGHT FOOT;  Surgeon: Edrick Kins, DPM;  Location: [place];  Service: Podiatry;  Laterality: Right;   LESION REMOVAL Right 11/23/2018   Procedure: EXCISION OF BENIGN LESION 1.9CM;  Surgeon: Edrick Kins, DPM;  Location: [facility];  Service:  Podiatry;  Laterality: Right;   OPEN PARTIAL HEPATECTOMY   10-29-2007   @[facility]    right sided for giant hemangioma and Cholecystectomy   STERIOD INJECTION Right 11/23/2018   Procedure: STEROID INJECTION;  Surgeon: Edrick Kins, DPM;  Location: [place];  Service: Podiatry;  Laterality: Right;   SUTURE REMOVAL Right 11/23/2018   Procedure: SUTURE REMOVAL TIMES FOUR;  Surgeon: Edrick Kins, DPM;  Location: [place];  Service: Podiatry;  Laterality: Right;   TUBAL LIGATION Bilateral 1995    OB History     Gravida  4   Para  4   Term  4   Preterm  0   AB  0   Living  4      SAB  0   IAB  0   Ectopic  0   Multiple  0   Live Births               Home Medications    Prior to Admission medications   Medication Sig Start Date End Date Taking? Authorizing Provider  acetaminophen (TYLENOL) 500 MG tablet Take 1,000 mg by mouth every 6 (six) hours as needed for moderate pain.    [provider]  ALPRAZolam Duanne Moron) 0.5 MG tablet Take 0.5 mg by mouth 3 (three) times daily. 10/16/18   [provider]  cephALEXin (KEFLEX) 500 MG capsule Take 1 capsule (500 mg total) by mouth 2 (two) times daily. 03/26/20   Volney American, PA-C  erythromycin ophthalmic ointment Place a 1/2 inch ribbon of ointment into the right lower eyelid twice daily as needed. 03/26/20   Volney American, PA-C  omeprazole (PRILOSEC) 20 MG capsule Take 1 capsule (20 mg total) by mouth 2 (two) times daily before a meal. Patient taking differently: Take 20 mg by mouth as needed. 10/26/16   McDonald, Mia A, PA-C  oxyCODONE-acetaminophen (PERCOCET) 5-325 MG tablet Take 1 tablet by mouth every 6 (six) hours as needed for severe pain. 11/23/18   Edrick Kins, DPM  traMADol (ULTRAM) 50 MG tablet Take 50 mg by mouth 3 (three) times daily as needed.  10/16/18   [provider]  diphenhydrAMINE (BENADRYL) 25 MG tablet Take 1 tablet (25 mg total) by  mouth every 6 (six) hours. Patient not taking: Reported on 06/09/2015 12/09/14 06/09/15  Jorje Guild, NP    Family History Family History  Problem Relation Age of Onset   Hypertension Father     Social History Social History   Tobacco Use   Smoking status: Every Day    Packs/day: 0.50    Years: 18.00    Pack years: 9.00    Types: Cigarettes   Smokeless tobacco: Never  Vaping Use   Vaping Use: Never used  Substance Use Topics   Alcohol use: Yes    Comment: socially   Drug use: Never     Allergies   Patient has no known allergies.   Review of Systems Review of Systems  Constitutional:  Negative for chills and fever.  Eyes:  Positive for photophobia and visual disturbance. Negative for discharge.  Respiratory:  Negative for shortness of breath.   Neurological:  Positive for dizziness and headaches.    Physical Exam Triage Vital Signs ED Triage Vitals  Enc Vitals Group     BP 01/25/21 1235 104/78     Pulse Rate 01/25/21 1235 88     Resp 01/25/21 1235 18     Temp 01/25/21 1235 98 F (36.7 C)     Temp Source 01/25/21 1235 Oral     SpO2 01/25/21 1235 99 %     Weight --      Height --      Head Circumference --      Peak Flow --      Pain Score 01/25/21 1237 10     Pain Loc --      Pain Edu? --      Excl. in [place]? --    No data found.  Updated Vital Signs BP 104/78 (BP Location: Right Arm)    Pulse 88    Temp 98 F (36.7 C) (Oral)    Resp 18    SpO2 99%   Physical Exam Vitals and nursing note reviewed.  Constitutional:      General: She is in acute distress (patient sitting in dark room, significantly bothered by light).     Appearance: Normal appearance.  HENT:     Head: Normocephalic and atraumatic.  Eyes:     Conjunctiva/sclera: Conjunctivae normal.     Pupils: Pupils are equal, round, and reactive to light.  Neurological:     Mental Status: She is alert.     Comments: Questionable left sided facial droop as well as mild left pronator drift.      UC Treatments / Results  Labs (all labs ordered are listed, but only abnormal results are displayed) Labs Reviewed - No data to display  EKG   Radiology No results found.  Procedures Procedures (including critical care time)  Medications Ordered in UC Medications - No data to display  Initial Impression / Assessment and Plan / UC Course  I have reviewed the triage vital signs and the nursing notes.  Pertinent labs & imaging results that were available during my care of the patient were reviewed by me and considered in my medical decision making (see chart for details).    Patient reports that facial weakness started early this morning. Recommended she have further evaluation to rule out stroke in ED. Patient is agreeable to EMS transport.   Final Clinical Impressions(s) / UC Diagnoses   Final diagnoses:  Facial weakness   Discharge Instructions   None    ED Prescriptions   None    PDMP not reviewed this encounter.   Francene Finders, PA-C 01/25/21 1318

## 2021-01-25 NOTE — ED Triage Notes (Signed)
Onset yesterday of migraine HA. Has been taking tylenol and ibuprofen without relief. Confirms intermittent dizziness and light sensitivity. No n/v.

## 2021-01-25 NOTE — ED Triage Notes (Signed)
Patient BIB [facility] from [facility]. Complaint of migraine that started yesterday, also complaint of sensitivity to light. VSS. NAD.

## 2021-01-25 NOTE — ED Provider Notes (Signed)
Emergency Medicine Provider Triage Evaluation Note  Julie Valentine , a 51 y.o. female  was evaluated in triage.  Pt complains of the worst headache she has ever had.  She reports that this began yesterday however at 4 AM and acutely worsened and woke her up out of her sleep.  No history of aneurysm or hemorrhage.  No history of hypertension.  Feels as though she is having intermittent problems with her vision as well.  Pain is worse when moving her head or even speaking.  Review of Systems  Positive: Photophobia Negative:   Physical Exam  BP 122/88    Pulse 75    Temp 98.3 F (36.8 C) (Oral)    Resp 18    SpO2 100%  Gen:   Awake, no distress   Resp:  Normal effort  MSK:   Moves extremities without difficulty  Other:  Patient with extreme photophobia.  Unable to participate in physical exam  Medical Decision Making  Medically screening exam initiated at 2:12 PM.  Appropriate orders placed.  Hosanna EULOGIA DISMORE was informed that the remainder of the evaluation will be completed by another provider, this initial triage assessment does not replace that evaluation, and the importance of remaining in the ED until their evaluation is complete.     Rhae Hammock, PA-C 01/25/21 1417    Godfrey Pick, MD 01/27/21 [phone]

## 2021-01-25 NOTE — ED Notes (Signed)
Pts visitor upset about wait time, the triage and waiting process was explained to the visitor in which he began to raise his voice and demand to speak to a supervisor. States that he wanting a "document in writing" stating that his family member was having to wait, he was made aware that there was nothing to give him at this time and that this NT was unsure about what he was wanting pt continued to raise his voice requiring this NT to walk away. Charge nurse made aware of situation, security notified.

## 2021-01-26 MED ORDER — ACETAMINOPHEN 325 MG PO TABS: 650.0000 mg | ORAL_TABLET | Freq: Once | ORAL | Status: DC

## 2021-01-26 MED ORDER — METOCLOPRAMIDE HCL 5 MG/ML IJ SOLN
10.0000 mg | Freq: Once | INTRAMUSCULAR | Status: AC
  Administered 2021-01-26: 10 mg via INTRAVENOUS
  Filled 2021-01-26: qty 2

## 2021-01-26 MED ORDER — DIPHENHYDRAMINE HCL 50 MG/ML IJ SOLN
50.0000 mg | Freq: Once | INTRAMUSCULAR | Status: AC
  Administered 2021-01-26: 50 mg via INTRAVENOUS
  Filled 2021-01-26: qty 1

## 2021-01-26 MED ORDER — KETOROLAC TROMETHAMINE 15 MG/ML IJ SOLN
15.0000 mg | Freq: Once | INTRAMUSCULAR | Status: AC
  Administered 2021-01-26: 15 mg via INTRAMUSCULAR
  Filled 2021-01-26: qty 1

## 2021-01-26 NOTE — Discharge Instructions (Addendum)
You came to the emergency department today to be evaluated for your headache.  Your physical exam, lab work, and CT scan were reassuring.  I have placed a referral to neurology.  It is important to follow-up with neurology for further management of your migraines.  Get help right away if: Your migraine headache becomes severe. Your migraine headache lasts longer than 72 hours. You have a fever. You have a stiff neck. You have vision loss. Your muscles feel weak or like you cannot control them. You start to lose your balance often. You have trouble walking. You faint. You have a seizure.

## 2021-01-26 NOTE — ED Provider Notes (Signed)
[facility] EMERGENCY DEPARTMENT Provider Note   CSN: [id number] Arrival date & time: 01/25/21  1358     History  Chief Complaint  Patient presents with   Headache    Julie Valentine is a 51 y.o. female migraine, generalized anxiety disorder, fibromyalgia.  Presents emergency department with a chief plaint of headache.  Patient reports that she has had a headache over the last 4 days.  Headache has been constant over this time.  Patient reports the pain is generalized throughout her entire head.  Patient rates pain 10/10 on the pain scale.  Pain is worse with bright lights and loud noises.  Patient has tried Tylenol and ibuprofen with no relief of symptoms.  Patient reports that this headache is similar to previous headaches she has had in the past.  Patient reports that she had intermittent blurry vision over the last 4 days.  Denies any numbness, weakness, facial asymmetry, dysarthria, neck pain, back pain.  Patient denies any recent falls or injuries.   Headache Associated symptoms: no abdominal pain, no back pain, no dizziness, no fever, no nausea, no neck pain, no numbness, no seizures, no vomiting and no weakness       Home Medications Prior to Admission medications   Medication Sig Start Date End Date Taking? Authorizing Provider  acetaminophen (TYLENOL) 500 MG tablet Take 1,000 mg by mouth every 6 (six) hours as needed for moderate pain.    [provider]  ALPRAZolam Duanne Moron) 0.5 MG tablet Take 0.5 mg by mouth 3 (three) times daily. 10/16/18   [provider]  cephALEXin (KEFLEX) 500 MG capsule Take 1 capsule (500 mg total) by mouth 2 (two) times daily. 03/26/20   Volney American, PA-C  erythromycin ophthalmic ointment Place a 1/2 inch ribbon of ointment into the right lower eyelid twice daily as needed. 03/26/20   Volney American, PA-C  omeprazole (PRILOSEC) 20 MG capsule Take 1 capsule (20 mg total) by mouth 2 (two) times daily  before a meal. Patient taking differently: Take 20 mg by mouth as needed. 10/26/16   McDonald, Mia A, PA-C  oxyCODONE-acetaminophen (PERCOCET) 5-325 MG tablet Take 1 tablet by mouth every 6 (six) hours as needed for severe pain. 11/23/18   Edrick Kins, DPM  traMADol (ULTRAM) 50 MG tablet Take 50 mg by mouth 3 (three) times daily as needed.  10/16/18   [provider]  diphenhydrAMINE (BENADRYL) 25 MG tablet Take 1 tablet (25 mg total) by mouth every 6 (six) hours. Patient not taking: Reported on 06/09/2015 12/09/14 06/09/15  Jorje Guild, NP      Allergies    Patient has no known allergies.    Review of Systems   Review of Systems  Constitutional:  Negative for chills and fever.  HENT:  Negative for facial swelling.   Eyes:  Positive for visual disturbance.  Respiratory:  Negative for shortness of breath.   Cardiovascular:  Negative for chest pain.  Gastrointestinal:  Negative for abdominal pain, nausea and vomiting.  Musculoskeletal:  Negative for back pain and neck pain.  Skin:  Negative for color change and rash.  Neurological:  Positive for headaches. Negative for dizziness, tremors, seizures, syncope, facial asymmetry, speech difficulty, weakness, light-headedness and numbness.  Psychiatric/Behavioral:  Negative for confusion.    Physical Exam Updated Vital Signs BP 115/76 (BP Location: Left Arm)    Pulse 73    Temp 98.9 F (37.2 C)    Resp 16  SpO2 95%  Physical Exam Vitals and nursing note reviewed.  Constitutional:      General: She is not in acute distress.    Appearance: She is not ill-appearing, toxic-appearing or diaphoretic.  HENT:     Head: Normocephalic and atraumatic.  Eyes:     General: No scleral icterus.       Right eye: No discharge.        Left eye: No discharge.     Extraocular Movements: Extraocular movements intact.     Conjunctiva/sclera: Conjunctivae normal.     Pupils: Pupils are equal, round, and reactive to light.  Cardiovascular:      Rate and Rhythm: Normal rate.  Pulmonary:     Effort: Pulmonary effort is normal.  Abdominal:     Palpations: Abdomen is soft.     Tenderness: There is no abdominal tenderness.  Musculoskeletal:     Cervical back: Normal range of motion and neck supple. No rigidity.  Skin:    General: Skin is warm and dry.  Neurological:     General: No focal deficit present.     Mental Status: She is alert.     GCS: GCS eye subscore is 4. GCS verbal subscore is 5. GCS motor subscore is 6.     Cranial Nerves: No cranial nerve deficit, dysarthria or facial asymmetry.     Sensory: Sensation is intact.     Motor: No weakness, tremor, seizure activity or pronator drift.     Coordination: Finger-Nose-Finger Test normal.     Gait: Gait is intact. Gait normal.     Comments: CN II-XII intact, equal grip strength, +5 strength to bilateral upper and lower extremities, station to light touch grossly intact to bilateral upper and lower extremities.  Psychiatric:        Behavior: Behavior is cooperative.    ED Results / Procedures / Treatments   Labs (all labs ordered are listed, but only abnormal results are displayed) Labs Reviewed  BASIC METABOLIC PANEL  CBC WITH DIFFERENTIAL/PLATELET  PROTIME-INR    EKG None  Radiology CT Head Wo Contrast  Result Date: 01/25/2021 CLINICAL DATA:  Worst headache of life, beginning yesterday. EXAM: CT HEAD WITHOUT CONTRAST TECHNIQUE: Contiguous axial images were obtained from the base of the skull through the vertex without intravenous contrast. COMPARISON:  None. FINDINGS: Brain: No evidence of acute infarction, hemorrhage, hydrocephalus, extra-axial collection or mass lesion/mass effect. Vascular: No hyperdense vessel or unexpected calcification. Skull: Normal. Negative for fracture or focal lesion. Sinuses/Orbits: Normal globes and orbits. Opacified right maxillary sinus. Opacified hypoplastic right frontal sinus. Scattered mucosal thickening in the right ethmoid air  cells. Other: None. IMPRESSION: 1. No acute intracranial abnormalities. 2. Sinus disease on the right including opacification of the right maxillary sinus and a hypoplastic right frontal sinus. Electronically Signed   By: Lajean Manes M.D.   On: 01/25/2021 15:06    Procedures Procedures    Medications Ordered in ED Medications  ketorolac (TORADOL) 15 MG/ML injection 15 mg (15 mg Intramuscular Given 01/26/21 0250)  metoCLOPramide (REGLAN) injection 10 mg (10 mg Intravenous Given 01/26/21 0406)  diphenhydrAMINE (BENADRYL) injection 50 mg (50 mg Intravenous Given 01/26/21 0405)    ED Course/ Medical Decision Making/ A&P                           Medical Decision Making  Alert 51 year old female in no acute distress, nontoxic-appearing.  Presents to ED with a chief complaint of  headache.  Headache present over the last 4 days.  She reports that she has a history of migraines and this headache is similar to previous headache she has had.  No recent falls or traumatic injuries.  Per chart review patient went to urgent care on 01/25/2021.  Patient told provider there that she was having left-sided facial weakness and blurred vision.  Patient denies the symptoms on my interview.  Neuro exam is reassuring. BMP, INR, and CBC are unremarkable CT head shows no acute intracranial abnormalities.  Will give patient migraine cocktail and reassess.  Patient reports improvement in headache after receiving migraine cocktail.  Continues to have no focal neurological deficits.  We will plan to discharge at this time.  Referral to neurology placed for further management of patient's migraines.   Discussed results, findings, treatment and follow up. Patient advised of return precautions. Patient verbalized understanding and agreed with plan.         Final Clinical Impression(s) / ED Diagnoses Final diagnoses:  Acute nonintractable headache, unspecified headache type    Rx / DC Orders ED Discharge  Orders          Ordered    Ambulatory referral to Neurology       Comments: An appointment is requested in approximately: 4 weeks   01/26/21 0517              Loni Beckwith, PA-C 01/26/21 9532    Merrily Pew, MD 01/26/21 0530

## 2022-08-23 ENCOUNTER — Encounter (HOSPITAL_BASED_OUTPATIENT_CLINIC_OR_DEPARTMENT_OTHER): Payer: Self-pay | Admitting: Podiatry

## 2022-08-23 ENCOUNTER — Other Ambulatory Visit: Payer: Self-pay

## 2022-08-23 NOTE — Progress Notes (Signed)
Spoke w/ via phone for pre-op interview---pt Lab needs dos----  none             Lab results------none COVID test -----patient states asymptomatic no test needed Arrive at -------1130 08-26-2022 NPO after MN NO Solid Food.  Clear liquids from MN until---1030 Med rec completed Medications to take morning of surgery -----gabapentin prn, omeprazole Diabetic medication -----n/a Patient instructed no nail polish to be worn day of surgery Patient instructed to bring photo id and insurance card day of surgery Patient aware to have Driver (ride ) / caregiver   [employer] fiance  for 24 hours after surgery  Patient Special Instructions -----none Pre-Op special Instructions -----none Patient verbalized understanding of instructions that were given at this phone interview. Patient denies shortness of breath, chest pain, fever, cough at this phone interview.  H & P/medical clearance dated 08-23-2022 from dr Leilani Able on chart for 08-26-2022 surgery

## 2022-08-26 ENCOUNTER — Encounter (HOSPITAL_BASED_OUTPATIENT_CLINIC_OR_DEPARTMENT_OTHER)
Admission: RE | Disposition: A | Discharge status: Home / Self Care | Payer: Self-pay | Source: Home / Self Care | Attending: Podiatry

## 2022-08-26 ENCOUNTER — Encounter (HOSPITAL_BASED_OUTPATIENT_CLINIC_OR_DEPARTMENT_OTHER): Payer: Self-pay | Admitting: Podiatry

## 2022-08-26 ENCOUNTER — Ambulatory Visit (HOSPITAL_BASED_OUTPATIENT_CLINIC_OR_DEPARTMENT_OTHER)
Admission: RE | Admit: 2022-08-26 | Discharge: 2022-08-26 | Disposition: A | Discharge status: Home / Self Care | Payer: BC Managed Care – PPO | Attending: Podiatry | Admitting: Podiatry

## 2022-08-26 ENCOUNTER — Ambulatory Visit (HOSPITAL_BASED_OUTPATIENT_CLINIC_OR_DEPARTMENT_OTHER): Payer: BC Managed Care – PPO

## 2022-08-26 ENCOUNTER — Ambulatory Visit (HOSPITAL_BASED_OUTPATIENT_CLINIC_OR_DEPARTMENT_OTHER): Payer: BC Managed Care – PPO | Admitting: Anesthesiology

## 2022-08-26 ENCOUNTER — Other Ambulatory Visit: Payer: Self-pay

## 2022-08-26 DIAGNOSIS — M216X2 Other acquired deformities of left foot: Secondary | ICD-10-CM | POA: Insufficient documentation

## 2022-08-26 DIAGNOSIS — G8918 Other acute postprocedural pain: Secondary | ICD-10-CM | POA: Diagnosis not present

## 2022-08-26 DIAGNOSIS — Z01818 Encounter for other preprocedural examination: Secondary | ICD-10-CM

## 2022-08-26 DIAGNOSIS — M21962 Unspecified acquired deformity of left lower leg: Secondary | ICD-10-CM | POA: Diagnosis not present

## 2022-08-26 DIAGNOSIS — M2012 Hallux valgus (acquired), left foot: Secondary | ICD-10-CM | POA: Diagnosis not present

## 2022-08-26 HISTORY — PX: WEIL OSTEOTOMY: SHX5044

## 2022-08-26 HISTORY — PX: GASTROC RECESSION EXTREMITY: SHX6262

## 2022-08-26 HISTORY — DX: Presence of dental prosthetic device (complete) (partial): Z97.2

## 2022-08-26 HISTORY — PX: BUNIONECTOMY: SHX129

## 2022-08-26 SURGERY — BUNIONECTOMY
Anesthesia: Monitor Anesthesia Care | Site: Toe | Laterality: Left

## 2022-08-26 MED ORDER — LACTATED RINGERS IV SOLN: INTRAVENOUS | Status: DC

## 2022-08-26 MED ORDER — HYDROMORPHONE HCL 1 MG/ML IJ SOLN: 0.2500 mg | INTRAMUSCULAR | Status: DC | PRN

## 2022-08-26 MED ORDER — MIDAZOLAM HCL 5 MG/5ML IJ SOLN
INTRAMUSCULAR | Status: DC | PRN
  Administered 2022-08-26: 2 mg via INTRAVENOUS

## 2022-08-26 MED ORDER — DEXAMETHASONE SODIUM PHOSPHATE 4 MG/ML IJ SOLN
INTRAMUSCULAR | Status: DC | PRN
  Administered 2022-08-26: 5 mg via INTRAVENOUS

## 2022-08-26 MED ORDER — ONDANSETRON HCL 4 MG/2ML IJ SOLN
INTRAMUSCULAR | Status: AC
  Filled 2022-08-26: qty 2

## 2022-08-26 MED ORDER — OXYCODONE HCL 5 MG/5ML PO SOLN: 5.0000 mg | Freq: Once | ORAL | Status: AC | PRN

## 2022-08-26 MED ORDER — OXYCODONE HCL 5 MG PO TABS
ORAL_TABLET | ORAL | Status: AC
  Filled 2022-08-26: qty 1

## 2022-08-26 MED ORDER — CHLORHEXIDINE GLUCONATE CLOTH 2 % EX PADS: 6.0000 | MEDICATED_PAD | Freq: Once | CUTANEOUS | Status: DC

## 2022-08-26 MED ORDER — CEFAZOLIN SODIUM-DEXTROSE 2-4 GM/100ML-% IV SOLN
INTRAVENOUS | Status: AC
  Filled 2022-08-26: qty 100

## 2022-08-26 MED ORDER — LIDOCAINE 2% (20 MG/ML) 5 ML SYRINGE
INTRAMUSCULAR | Status: DC | PRN
  Administered 2022-08-26: 100 mg via INTRAVENOUS

## 2022-08-26 MED ORDER — 0.9 % SODIUM CHLORIDE (POUR BTL) OPTIME
TOPICAL | Status: DC | PRN
  Administered 2022-08-26: 500 mL

## 2022-08-26 MED ORDER — MIDAZOLAM HCL 2 MG/2ML IJ SOLN
2.0000 mg | Freq: Once | INTRAMUSCULAR | Status: AC
  Administered 2022-08-26: 2 mg via INTRAVENOUS

## 2022-08-26 MED ORDER — FENTANYL CITRATE (PF) 100 MCG/2ML IJ SOLN
INTRAMUSCULAR | Status: AC
  Filled 2022-08-26: qty 2

## 2022-08-26 MED ORDER — PROPOFOL 10 MG/ML IV BOLUS
INTRAVENOUS | Status: DC | PRN
Start: 2022-08-26 — End: 2022-08-26
  Administered 2022-08-26: 200 mg via INTRAVENOUS
  Administered 2022-08-26: 50 mg via INTRAVENOUS

## 2022-08-26 MED ORDER — FENTANYL CITRATE (PF) 100 MCG/2ML IJ SOLN
100.0000 ug | Freq: Once | INTRAMUSCULAR | Status: AC
  Administered 2022-08-26: 100 ug via INTRAVENOUS

## 2022-08-26 MED ORDER — MIDAZOLAM HCL 2 MG/2ML IJ SOLN
INTRAMUSCULAR | Status: AC
  Filled 2022-08-26: qty 2

## 2022-08-26 MED ORDER — PROMETHAZINE HCL 25 MG/ML IJ SOLN: 6.2500 mg | INTRAMUSCULAR | Status: DC | PRN

## 2022-08-26 MED ORDER — FENTANYL CITRATE (PF) 100 MCG/2ML IJ SOLN
INTRAMUSCULAR | Status: DC | PRN
  Administered 2022-08-26 (×4): 25 ug via INTRAVENOUS

## 2022-08-26 MED ORDER — CEFAZOLIN SODIUM-DEXTROSE 2-4 GM/100ML-% IV SOLN
2.0000 g | INTRAVENOUS | Status: AC
  Administered 2022-08-26: 2 g via INTRAVENOUS

## 2022-08-26 MED ORDER — OXYCODONE HCL 5 MG PO TABS
5.0000 mg | ORAL_TABLET | Freq: Once | ORAL | Status: AC | PRN
  Administered 2022-08-26: 5 mg via ORAL

## 2022-08-26 MED ORDER — PHENYLEPHRINE HCL (PRESSORS) 10 MG/ML IV SOLN
INTRAVENOUS | Status: DC | PRN
  Administered 2022-08-26 (×5): 80 ug via INTRAVENOUS

## 2022-08-26 MED ORDER — DEXAMETHASONE SODIUM PHOSPHATE 10 MG/ML IJ SOLN
INTRAMUSCULAR | Status: AC
  Filled 2022-08-26: qty 1

## 2022-08-26 MED ORDER — ONDANSETRON HCL 4 MG/2ML IJ SOLN
INTRAMUSCULAR | Status: DC | PRN
Start: 2022-08-26 — End: 2022-08-26
  Administered 2022-08-26: 4 mg via INTRAVENOUS

## 2022-08-26 MED ORDER — PROPOFOL 10 MG/ML IV BOLUS
INTRAVENOUS | Status: AC
  Filled 2022-08-26: qty 20

## 2022-08-26 MED ORDER — ROPIVACAINE HCL 5 MG/ML IJ SOLN
INTRAMUSCULAR | Status: DC | PRN
  Administered 2022-08-26: 30 mL via PERINEURAL

## 2022-08-26 SURGICAL SUPPLY — 106 items
APL PRP STRL LF DISP 70% ISPRP (MISCELLANEOUS) ×2
BANDAGE ESMARK 6X9 LF (GAUZE/BANDAGES/DRESSINGS) ×2 IMPLANT
BIT DRILL 1.3 (BIT) ×2
BIT DRILL 1.7 LNG CANN (DRILL) IMPLANT
BIT DRILL 100X1.3XAO CNCT (BIT) IMPLANT
BIT DRL 100X1.3XAO CNCT (BIT) ×2
BLADE AVERAGE 25X9 (BLADE) IMPLANT
BLADE MINI RND TIP GREEN BEAV (BLADE) IMPLANT
BLADE OSC/SAG .038X5.5 CUT EDG (BLADE) IMPLANT
BLADE OSCILLATING/SAGITTAL (BLADE) ×2
BLADE SURG 15 STRL LF DISP TIS (BLADE) ×4 IMPLANT
BLADE SURG 15 STRL SS (BLADE) ×8
BLADE SW THK.38XMED LNG THN (BLADE) ×2 IMPLANT
BNDG CMPR 5X3 KNIT ELC UNQ LF (GAUZE/BANDAGES/DRESSINGS) ×2
BNDG CMPR 5X4 KNIT ELC UNQ LF (GAUZE/BANDAGES/DRESSINGS) ×2
BNDG CMPR 6 X 5 YARDS HK CLSR (GAUZE/BANDAGES/DRESSINGS)
BNDG CMPR 75X21 PLY HI ABS (MISCELLANEOUS)
BNDG CMPR 9X4 STRL LF SNTH (GAUZE/BANDAGES/DRESSINGS) ×2
BNDG CMPR 9X6 STRL LF SNTH (GAUZE/BANDAGES/DRESSINGS) ×2
BNDG ELASTIC 3INX 5YD STR LF (GAUZE/BANDAGES/DRESSINGS) ×2 IMPLANT
BNDG ELASTIC 4INX 5YD STR LF (GAUZE/BANDAGES/DRESSINGS) ×2 IMPLANT
BNDG ELASTIC 6INX 5YD STR LF (GAUZE/BANDAGES/DRESSINGS) IMPLANT
BNDG ESMARK 4X9 LF (GAUZE/BANDAGES/DRESSINGS) ×2 IMPLANT
BNDG ESMARK 6X9 LF (GAUZE/BANDAGES/DRESSINGS) ×2
BNDG GAUZE DERMACEA FLUFF 4 (GAUZE/BANDAGES/DRESSINGS) ×2 IMPLANT
BNDG GZE DERMACEA 4 6PLY (GAUZE/BANDAGES/DRESSINGS) ×2
CHLORAPREP W/TINT 26 (MISCELLANEOUS) ×2 IMPLANT
COUNTERSINK HEADED 2.5 (ORTHOPEDIC DISPOSABLE SUPPLIES) ×2
COVER BACK TABLE 60X90IN (DRAPES) ×2 IMPLANT
COVER K-WIRE PIN .062/1.6 (PIN)
CUFF TOURN SGL QUICK 18X4 (TOURNIQUET CUFF) IMPLANT
CUFF TOURN SGL QUICK 24 (TOURNIQUET CUFF)
CUFF TOURN SGL QUICK 34 (TOURNIQUET CUFF) ×2
CUFF TRNQT CYL 24X4X16.5-23 (TOURNIQUET CUFF) IMPLANT
CUFF TRNQT CYL 34X4.125X (TOURNIQUET CUFF) ×2 IMPLANT
DRAPE 3/4 80X56 (DRAPES) ×2 IMPLANT
DRAPE C-ARM 35X43 STRL (DRAPES) IMPLANT
DRAPE C-ARM 42X120 X-RAY (DRAPES) ×2 IMPLANT
DRAPE C-ARMOR (DRAPES) IMPLANT
DRAPE EXTREMITY T 121X128X90 (DISPOSABLE) ×2 IMPLANT
DRAPE SHEET LG 3/4 BI-LAMINATE (DRAPES) ×2 IMPLANT
DRAPE U-SHAPE 47X51 STRL (DRAPES) ×2 IMPLANT
ELECT REM PT RETURN 9FT ADLT (ELECTROSURGICAL) ×2
ELECTRODE REM PT RTRN 9FT ADLT (ELECTROSURGICAL) ×2 IMPLANT
GAUZE 4X4 16PLY ~~LOC~~+RFID DBL (SPONGE) ×2 IMPLANT
GAUZE PAD ABD 8X10 STRL (GAUZE/BANDAGES/DRESSINGS) ×2 IMPLANT
GAUZE SPONGE 4X4 12PLY STRL (GAUZE/BANDAGES/DRESSINGS) ×2 IMPLANT
GAUZE STRETCH 2X75IN STRL (MISCELLANEOUS) IMPLANT
GAUZE XEROFORM 1X8 LF (GAUZE/BANDAGES/DRESSINGS) ×2 IMPLANT
GAUZE XEROFORM 5X9 LF (GAUZE/BANDAGES/DRESSINGS) IMPLANT
GLOVE BIO SURGEON STRL SZ7 (GLOVE) ×4 IMPLANT
GLOVE BIO SURGEON STRL SZ7.5 (GLOVE) ×2 IMPLANT
GLOVE BIO SURGEON STRL SZ8 (GLOVE) ×4 IMPLANT
GLOVE BIOGEL PI IND STRL 8 (GLOVE) ×2 IMPLANT
GOWN STRL REUS W/TWL 2XL LVL3 (GOWN DISPOSABLE) ×2 IMPLANT
K-WIRE SURGICAL 1.6X102 (WIRE) IMPLANT
KIT TURNOVER CYSTO (KITS) ×2 IMPLANT
NDL HYPO 25X1 1.5 SAFETY (NEEDLE) ×2 IMPLANT
NDL SAFETY ECLIP 18X1.5 (MISCELLANEOUS) IMPLANT
NEEDLE HYPO 25X1 1.5 SAFETY (NEEDLE) ×2 IMPLANT
NS IRRIG 1000ML POUR BTL (IV SOLUTION) IMPLANT
NS IRRIG 500ML POUR BTL (IV SOLUTION) ×2 IMPLANT
Olive Wire 1.3 mm IMPLANT
PACK BASIN DAY SURGERY FS (CUSTOM PROCEDURE TRAY) ×2 IMPLANT
PAD CAST 4YDX4 CTTN HI CHSV (CAST SUPPLIES) IMPLANT
PADDING CAST ABS COTTON 4X4 ST (CAST SUPPLIES) ×2 IMPLANT
PADDING CAST COTTON 4X4 STRL (CAST SUPPLIES) ×2
PENCIL SMOKE EVACUATOR (MISCELLANEOUS) ×2 IMPLANT
PIN GUARD 1.57MM GREEN STERILE (PIN) IMPLANT
PLATE 6H LOCK (Plate) IMPLANT
PLATE 6HOLE LOCK (Plate) ×2 IMPLANT
SCREW CANN HDLS ST 2.5X22 (Screw) IMPLANT
SCREW CANN SHRT THRD 2.5X18 (Screw) IMPLANT
SCREW COUNTERSINK HEADED 2.5 (ORTHOPEDIC DISPOSABLE SUPPLIES) IMPLANT
SCREW LOCKING 2.0X12 (Screw) IMPLANT
SLEEVE SCD COMPRESS KNEE MED (STOCKING) ×2 IMPLANT
SPLINT FIBERGLASS 5X30 (CAST SUPPLIES) IMPLANT
STAPLER VISISTAT 35W (STAPLE) IMPLANT
STOCKINETTE 6 STRL (DRAPES) ×2 IMPLANT
SUCTION TUBE FRAZIER 10FR DISP (SUCTIONS) ×2 IMPLANT
SUT ETHILON 3 0 PS 1 (SUTURE) ×2 IMPLANT
SUT ETHILON 4 0 PS 2 18 (SUTURE) IMPLANT
SUT MNCRL AB 3-0 PS2 27 (SUTURE) ×2 IMPLANT
SUT MNCRL AB 4-0 PS2 18 (SUTURE) ×2 IMPLANT
SUT MON AB 4-0 PS1 27 (SUTURE) IMPLANT
SUT MON AB 5-0 PS2 18 (SUTURE) IMPLANT
SUT VIC AB 2-0 PS2 27 (SUTURE) IMPLANT
SUT VIC AB 2-0 SH 27 (SUTURE)
SUT VIC AB 2-0 SH 27XBRD (SUTURE) IMPLANT
SUT VIC AB 3-0 FS2 27 (SUTURE) IMPLANT
SUT VIC AB 3-0 SH 27 (SUTURE) ×4
SUT VIC AB 3-0 SH 27XBRD (SUTURE) IMPLANT
SUT VIC AB 4-0 PS2 27 (SUTURE) IMPLANT
SUT VICRYL RAPIDE 3 0 (SUTURE) IMPLANT
SYR 10ML LL (SYRINGE) IMPLANT
SYR BULB EAR ULCER 3OZ GRN STR (SYRINGE) ×2 IMPLANT
SYR CONTROL 10ML LL (SYRINGE) ×2 IMPLANT
TOWEL OR 17X24 6PK STRL BLUE (TOWEL DISPOSABLE) ×2 IMPLANT
TRAY DSU PREP LF (CUSTOM PROCEDURE TRAY) IMPLANT
TUBE CONNECTING 12X1/4 (SUCTIONS) ×2 IMPLANT
UNDERPAD 30X36 HEAVY ABSORB (UNDERPADS AND DIAPERS) ×2 IMPLANT
WIRE OLIVE SMOOTH 1.3 (WIRE) IMPLANT
WIRE SMOOTH TROCAR .9MMX150MML (WIRE) IMPLANT
YANKAUER SUCT BULB TIP NO VENT (SUCTIONS) IMPLANT
countersink 2.5mm headless IMPLANT
kwire IMPLANT

## 2022-08-26 NOTE — Anesthesia Postprocedure Evaluation (Signed)
Anesthesia Post Note  Patient: Julie Valentine  Procedure(s) Performed: DOUBLE OSTEOTOMY BUNIONECTOMY (Left: Toe) WEIL OSTEOTOMY (Left) GASTRONURICKS RECESSION (Left)     Patient location during evaluation: PACU Anesthesia Type: General Level of consciousness: awake and alert, patient cooperative and oriented Pain management: pain level controlled Vital Signs Assessment: post-procedure vital signs reviewed and stable Respiratory status: spontaneous breathing, nonlabored ventilation and respiratory function stable Cardiovascular status: blood pressure returned to baseline and stable Postop Assessment: no apparent nausea or vomiting Anesthetic complications: no   No notable events documented.  Last Vitals:  Vitals:   08/26/22 1545 08/26/22 1549  BP:    Pulse: 72 90  Resp: (!) 25 16  Temp:    SpO2: 98% 97%    Last Pain:  Vitals:   08/26/22 1118  TempSrc: Oral  PainSc: 10-Worst pain ever                 Harrison Zetina,E. Wessie Shanks

## 2022-08-26 NOTE — Transfer of Care (Signed)
Immediate Anesthesia Transfer of Care Note  Patient: Julie Valentine  Procedure(s) Performed: Procedure(s) (LRB): DOUBLE OSTEOTOMY BUNIONECTOMY (Left) WEIL OSTEOTOMY (Left) GASTRONURICKS RECESSION (Left)  Patient Location: PACU  Anesthesia Type: General  Level of Consciousness: awake, sedated, patient cooperative and responds to stimulation  Airway & Oxygen Therapy: Patient Spontanous Breathing and Patient connected to [place] oxygen  Post-op Assessment: Report given to PACU RN, Post -op Vital signs reviewed and stable and Patient moving all extremities  Post vital signs: Reviewed and stable  Complications: No apparent anesthesia complications

## 2022-08-26 NOTE — H&P (Signed)
Anesthesia H&P Update: History and Physical Exam reviewed; patient is OK for planned anesthetic and procedure. ? ?

## 2022-08-26 NOTE — H&P (Signed)
History and Physical Interval Note:  08/26/2022 12:27 PM  Julie Valentine  has presented today for surgery, with the diagnosis of hallux valgus, plantarflexed metatarsal, equinus deformity.  The various methods of treatment have been discussed with the patient and family. After consideration of risks, benefits and other options for treatment, the patient has consented to  Double osteotomy bunionectomy, achilles tendon lengthening, metatarsal elevation osteotomy left foot  as a surgical intervention.  The patient's history has been reviewed, patient examined, no change in status, stable for surgery.  I have reviewed the patient's chart and labs.  Questions were answered to the patient's satisfaction.     Milea Klink Paulla Dolly

## 2022-08-26 NOTE — Progress Notes (Signed)
Assisted Dr. Nevada Crane with left, popliteal, ultrasound guided block. Side rails up, monitors on throughout procedure. See vital signs in flow sheet. Tolerated Procedure well.

## 2022-08-26 NOTE — Anesthesia Procedure Notes (Signed)
Procedure Name: LMA Insertion Date/Time: 08/26/2022 2:23 PM  Performed by: Jessica Priest, CRNAPre-anesthesia Checklist: Patient identified, Emergency Drugs available, Suction available, Patient being monitored and Timeout performed Patient Re-evaluated:Patient Re-evaluated prior to induction Oxygen Delivery Method: Circle system utilized Preoxygenation: Pre-oxygenation with 100% oxygen Induction Type: IV induction Ventilation: Mask ventilation without difficulty LMA: LMA inserted LMA Size: 4.0 Number of attempts: 1 Airway Equipment and Method: Bite block Placement Confirmation: positive ETCO2, breath sounds checked- equal and bilateral and CO2 detector Tube secured with: Tape Dental Injury: Teeth and Oropharynx as per pre-operative assessment

## 2022-08-26 NOTE — Anesthesia Preprocedure Evaluation (Addendum)
Anesthesia Evaluation  Patient identified by MRN, date of birth, ID band Patient awake    Reviewed: Allergy & Precautions, NPO status , Patient's Chart, lab work & pertinent test results  History of Anesthesia Complications Negative for: history of anesthetic complications  Airway Mallampati: II  TM Distance: >3 FB Neck ROM: Full    Dental no notable dental hx.    Pulmonary Current Smoker and Patient abstained from smoking.   Pulmonary exam normal breath sounds clear to auscultation       Cardiovascular negative cardio ROS Normal cardiovascular exam Rhythm:Regular Rate:Normal     Neuro/Psych  Headaches PSYCHIATRIC DISORDERS Anxiety      Neuromuscular disease    GI/Hepatic Neg liver ROS,GERD  Controlled and Medicated,,  Endo/Other  negative endocrine ROS    Renal/GU negative Renal ROS     Musculoskeletal  (+)  Fibromyalgia -  Abdominal  (+) + obese  Peds  Hematology negative hematology ROS (+)   Anesthesia Other Findings   Reproductive/Obstetrics S/p BTL                             Anesthesia Physical Anesthesia Plan  ASA: II  Anesthesia Plan: General   Post-op Pain Management: Regional block* and Minimal or no pain anticipated   Induction: Intravenous  PONV Risk Score and Plan: 2 and Treatment may vary due to age or medical condition, Ondansetron and Midazolam  Airway Management Planned: LMA  Additional Equipment:   Intra-op Plan:   Post-operative Plan:   Informed Consent: I have reviewed the patients History and Physical, chart, labs and discussed the procedure including the risks, benefits and alternatives for the proposed anesthesia with the patient or authorized representative who has indicated his/her understanding and acceptance.     Dental advisory given  Plan Discussed with: CRNA  Anesthesia Plan Comments:         Anesthesia Quick Evaluation

## 2022-08-26 NOTE — Discharge Instructions (Addendum)
Post-Operative Discharge Instructions  Patient name: Julie Valentine MRN: [id number] DOB: 01-26-1970  Date of Surgery: 08/26/2022            Location: [facility]  Follow up appointment:  Next week  On the Day of Surgery: Please pick up prescription(s) prior to the morning of surgery.  Please go directly to the couch or bed and keep your feet elevated by putting two pillows under your feet and one pillow under your knees. Keep your feet out from under the blanket.   Discomfort and Swelling: Your foot may be numb for the remainder of the day. Swelling is expected. In some cases, the skin of the foot or the leg may take on a bruised, black and blue appearance.   Temperature: Take your temperature on the 2nd, 3rd, and 4th day after your surgery at 5:00pm. If your temperature is above 101 degrees, please call the physician's office.   Bleeding: A slight amount of drainage on the dressing is normal. Resting your foot in an elevated position will limit bleeding. If bleeding continues, wrap a towel around your foot and apply an ice pack.   Dressing: Keep the bandage absolutely dry and DO NOT remove the dressing.  If the dressing becomes wet or soiled, call your physician's office immediately.   Stitches: The stitches will remain in place for about two weeks, depending on the nature of your operation. Slight pulling sensation may be felt due to the stitches. This is a normal occurrence.   Ice: Apply a well-sealed ice pack to your foot or behind your knee for 20 minutes out of every hour for the first 24 hours after your surgery. DO NOT leave the ice pack in place at bedtime or during long naps. Do not place an ice pack directly on skin, place a washcloth between the ice pack and the dressing. This will help avoid getting the dressing wet, and help avoid any ice burn.  Weight Bearing Status:  Non-weight bearing in CAM walker   Shoes: Wear your post-operative shoe or boot anytime you  put weight on your feet, unless noted above. DO NOT DRIVE WITH BOOT ON RIGHT FOOT.  Diet: Return to your regular diet slowly within 24 hours. If you received sedation, your first meal should be light. Do not eat greasy or spicy foods for the first meal. Drink large quantities of liquids, especially water, citrus and other fruit juices. Please call if you're unable to keep fluids down. Eat food with your medication.  General Activities: Recovery is a gradual process; however you should feel better with each passing day. The first day, leave the bed only to go to the bathroom. Gradually increase your activity after the first day. For the first week or two, resting each day is important. Strenuous work, heavy lifting and excessive social activities should be avoided. Rest. Ice. Elevate.  Postoperative Care: The post-operative care periods lasts for approximately 6-12 weeks. During this time, periodic visits to your physician's office will be required so your healing process can be monitored closely. It is essential for your recovery that you continue to be monitored by your physician until you are discharged and completely healed. Care during this time is the most important part of your recovery process.   Recovery from anesthesia: You may feel drowsy and reflexes may be slowed for 24 hours. Do not drive, use machinery, appliances, ride bicycles, or scooter. Do not make important decisions.   Complications: Please call your  physician following surgery if you have any of the following complications:          1.)  Severe pain unrelieved by medication   2.) Excessive heavy or prolonged bleeding   3.) Dizziness or fainting   4.) Soiled or wet dressings   5.) Temperature over 101.0 degrees   Medications: Oxycodone 5 mg every 6 hours as needed for pain Aspirin 81 mg day and night starting tomorrow AM  Post Anesthesia Home Care Instructions  Activity: Get plenty of rest for the remainder of the day. A  responsible adult should stay with you for 24 hours following the procedure.  For the next 24 hours, DO NOT: -Drive a car -Advertising copywriter -Drink alcoholic beverages -Take any medication unless instructed by your physician -Make any legal decisions or sign important papers.  Meals: Start with liquid foods such as gelatin or soup. Progress to regular foods as tolerated. Avoid greasy, spicy, heavy foods. If nausea and/or vomiting occur, drink only clear liquids until the nausea and/or vomiting subsides. Call your physician if vomiting continues.  Special Instructions/Symptoms: Your throat may feel dry or sore from the anesthesia or the breathing tube placed in your throat during surgery. If this causes discomfort, gargle with warm salt water. The discomfort should disappear within 24 hours.     Regional Anesthesia Blocks  1. Numbness or the inability to move the "blocked" extremity may last from 3-48 hours after placement. The length of time depends on the medication injected and your individual response to the medication. If the numbness is not going away after 48 hours, call your surgeon.  2. The extremity that is blocked will need to be protected until the numbness is gone and the  Strength has returned. Because you cannot feel it, you will need to take extra care to avoid injury. Because it may be weak, you may have difficulty moving it or using it. You may not know what position it is in without looking at it while the block is in effect.  3. For blocks in the legs and feet, returning to weight bearing and walking needs to be done carefully. You will need to wait until the numbness is entirely gone and the strength has returned. You should be able to move your leg and foot normally before you try and bear weight or walk. You will need someone to be with you when you first try to ensure you do not fall and possibly risk injury.  4. Bruising and tenderness at the needle site are common  side effects and will resolve in a few days.  5. Persistent numbness or new problems with movement should be communicated to the surgeon or the [facility] [phone] [facility] [phone]).

## 2022-08-26 NOTE — Op Note (Signed)
OPERATIVE REPORT Patient name: Julie Valentine MRN: [id number] DOB: 1970/09/03  DOS:  08/26/2022  Preop Dx: Equinus, Hallux valgus, plantarflexed third metatarsal.  Pre-op testing - Plan: CBG per Guidelines for Diabetes Management for Patients Undergoing Surgery ([facility], [facility], and [facility] only), CBG per protocol, CBG per Guidelines for Diabetes Management for Patients Undergoing Surgery ([facility], [facility], and [facility] only), CBG per protocol Postop Dx: same  Procedure:  1. Tendoachilles lengthening  2. Double osteotomy bunionectomy 3. Metatarsal osteotomy  Surgeon: Hinton Rao, DPM  Anesthesia: Pop block per anesthesia team.  Hemostasis: Calf tourniquet inflated to a pressure of after esmarch exsanguination    EBL: 10 mL Materials: 2.5 cannulated paragon screw 18 mm. 22 mm 2.0 cannulated headless screw, paragon mini monster screw small frag plating system Injectables: none Pathology: none  Condition: The patient tolerated the procedure and anesthesia well. No complications noted or reported   Justification for procedure: The patient is a 52 y.o. female who presents today for surgical correction of hallux valgus, equinus, plantarflexed third metatarsal. All conservative modalities of been unsuccessful in providing any sort of satisfactory alleviation of symptoms with the patient. The patient was identified preoperatively and the correct operative extremity was marked by myself. Patient was educated pre-operatively on risks, complications, and alternatives. The patient consented for surgical correction. The patient consent form was reviewed. All patient questions were answered. No guarantees were expressed or implied. The patient and the surgeon both signed the patient consent form with the witness present and placed in the patient's chart.   Procedure in Detail: The patient was brought to the operating room, placed on the operating table in a supine position and anesthesia was induced. The left lower  extremity was prepped and draped in a standard sterile fashion and a timeout was performed to verify the correct operative site. Attention was then directed to the surgical area where procedure number one commenced.  Procedure #1:   Percutaneous tendo Achilles lengthening: I began the case by creating a small poke hole incision into the central fibers of the Achilles tendon 2 cm proximal to the insertion site.  The blade was rotated 90 degrees laterally, transecting the lateral 50% of the tendon.  The blade was removed and then reinserted 2 cm proximal to this and rotated 90 degrees medially, transecting the medial 50% of the tendon.  The blade was removed and reinserted 2 cm proximal to the second incision and again rotated 90 degrees laterally, transecting the lateral 50% of the tendon.  At this point we were then able to passively dorsiflex the ankle to a neutral position even with the heel maximally corrected.  Double osteotomy Bunionectomy CPT [id number]:  I began the case by creating a dorsal-medial incision at the level of the first metatarsophalangeal joint.  The incision was carefully deepened down through the soft tissues to the level of the capsule.  The capsule was incised longitudinally along its medial aspect and then elevated along the dorsal aspect of the first metatarsal head.  An oscillating saw was utilized to resect the medial eminence, approximately 1 mm medial to the sagittal sulcus.  We then proceeded to utilize the oscillating saw to create a chevron osteotomy through the head of the first metatarsal with a short arm dorsally and longer limb plantarly.  The osteotomy site was freed up with a hand osteotome and the capital fragment was then shifted approximately 6 mm laterally.  I was happy with the position and proceeded to then pin the correction  with a guidepin for the screw.  A fluoroscopic image was obtained and I was happy with the overall alignment clinically and fluoroscopically.   The pin was overreamed with the cannulated drill bit and and the screw was then placed across the osteotomy site from dorsal to plantar.  We had excellent purchase with the screw.  The guidepin was removed and the overhanging bone medially was resected with the oscillating saw.    Next the area overlying the proximal phalanx was identified. The incision was carefully deepened down through the soft tissues to the level of bone. Then the saw blade was introduced through this incision and an oblique osteotomy was made through the shaft of the proximal phalanx making sure to preserve a lateral hinge of bone on the proximal phalanx. Then the osteotomy was closed down to achieve the required correction. A guide pin for a headless compression screw was then inserted obliquely perpendicular to the osteotomy site. The position for the guidepin was verified utilizing intraoperative fluoroscopy. A small incision was then made overlying the guide pin taking care to mobilize and retract the subcutaneous neurovascular structures. The guide pin was overreamed with the cannulated drill bit and and the screw was then placed across the osteotomy site from distal to proximal. The position of the hardware as well as the toe was then verified utilizing intraoperative fluoroscopy. At the completion of the procedure the toe position was adequately reduced. The medial capsule was closed with interrupted 2-0 Vicryl cruciate sutures.  This was done while maintaining the alignment of the hallux with gauze placed within the first webspace.  metatarsal osteotomy with  Metatarsal phalangeal Joint Release CPT   Contracture was noted at the third metatarsophalangeal joint. An incision was made overlying the affected metatarsophalangeal joint and dissection was made down to the joint releasing the capsule on both sides of the joint and releasing the extensor brevis tendon. The periosteum was excised from the dorsum of the metatarsal and a  saggittal saw was utilized to make an osteotomy perpendicular to the long axis of the metatarsal.  A 4 mm wedge of bone was resected.. The capital fragment was then translated proximally and the correction was checked under fluoroscopy.  Good bone to bone contact was noted between the capital fragment and the proximal third metatarsal.  Next a 5 mm plate was selected and was placed over the third metatarsal utilizing fluoroscopic guidance.  The proximal drill screws were then drilled and filled with 2.0 screws .  Next the drill holes for the distal screws were reamed  making sure to avoid plantar translation of the osteotomy site. when the appropriate correction was achieved. The digit was able to now lay in a straight manner and the plantar prominence was noted to be eliminated.  Next subcutaneous closure was achieved with 3-0 vicryl in an interrupted fashion and skin closure was achieved utilizing vicryl rapide in a running fashion   Dry sterile compressive dressings were then applied to all previously mentioned incision sites about the patient's lower extremity. The tourniquet which was used for hemostasis was deflated. All normal neurovascular responses including pink color and warmth returned all the digits of patient's lower extremity.  The patient was then transferred from the operating room to the recovery room having tolerated the procedure and anesthesia well. All vital signs are stable. After a brief stay in the recovery room the patient was discharged with adequate prescriptions for analgesia. Verbal as well as written instructions were provided for the  patient regarding wound care. The patient is to keep the dressings clean dry and intact until they are to follow surgeon Dr. Hinton Rao, DPM in the office upon discharge.   Hinton Rao DPM

## 2022-08-26 NOTE — Progress Notes (Signed)
Tc to office of Dr. Theresia Bough, office states Dr. Theresia Bough sent in e-prescription to patient's pharmacy listed in Epic for oxycodone 5 mg 1 tablet every 6 hours prn pain. Informed pt, pt verbalized understanding.  Julie Furbish, RN

## 2022-08-26 NOTE — Anesthesia Procedure Notes (Signed)
Anesthesia Regional Block: Popliteal block   Pre-Anesthetic Checklist: , timeout performed,  Correct Patient, Correct Site, Correct Laterality,  Correct Procedure, Correct Position, site marked,  Risks and benefits discussed,  Surgical consent,  Pre-op evaluation,  At surgeon's request and post-op pain management  Laterality: Left  Prep: chloraprep       Needles:  Injection technique: Single-shot  Needle Type: Stimiplex     Needle Length: 9cm  Needle Gauge: 21     Additional Needles:   Procedures:,,,, ultrasound used (permanent image in chart),,    Narrative:  Start time: 08/26/2022 12:35 PM End time: 08/26/2022 12:40 PM Injection made incrementally with aspirations every 5 mL.  Performed by: Personally  Anesthesiologist: Lowella Curb, MD

## 2022-08-31 ENCOUNTER — Encounter (HOSPITAL_BASED_OUTPATIENT_CLINIC_OR_DEPARTMENT_OTHER): Payer: Self-pay | Admitting: Podiatry

## 2022-09-05 DIAGNOSIS — M2012 Hallux valgus (acquired), left foot: Secondary | ICD-10-CM | POA: Diagnosis not present

## 2022-09-05 DIAGNOSIS — M24572 Contracture, left ankle: Secondary | ICD-10-CM | POA: Diagnosis not present

## 2022-09-05 DIAGNOSIS — M21962 Unspecified acquired deformity of left lower leg: Secondary | ICD-10-CM | POA: Diagnosis not present

## 2022-09-30 DIAGNOSIS — B351 Tinea unguium: Secondary | ICD-10-CM | POA: Diagnosis not present

## 2022-09-30 DIAGNOSIS — M24572 Contracture, left ankle: Secondary | ICD-10-CM | POA: Diagnosis not present

## 2022-09-30 DIAGNOSIS — L089 Local infection of the skin and subcutaneous tissue, unspecified: Secondary | ICD-10-CM | POA: Diagnosis not present

## 2022-09-30 DIAGNOSIS — M21962 Unspecified acquired deformity of left lower leg: Secondary | ICD-10-CM | POA: Diagnosis not present

## 2022-09-30 DIAGNOSIS — M2012 Hallux valgus (acquired), left foot: Secondary | ICD-10-CM | POA: Diagnosis not present

## 2022-10-12 ENCOUNTER — Other Ambulatory Visit (HOSPITAL_COMMUNITY): Payer: Self-pay

## 2022-10-12 MED ORDER — GABAPENTIN 300 MG PO CAPS
300.0000 mg | ORAL_CAPSULE | Freq: Three times a day (TID) | ORAL | 0 refills | Status: AC
  Filled 2022-10-12: qty 21, 7d supply, fill #0

## 2022-10-12 MED ORDER — NALOXONE HCL 4 MG/0.1ML NA LIQD
NASAL | 3 refills | Status: AC
  Filled 2022-10-12: qty 2, 30d supply, fill #0

## 2022-10-12 MED ORDER — OXYCODONE-ACETAMINOPHEN 5-325 MG PO TABS
1.0000 | ORAL_TABLET | Freq: Four times a day (QID) | ORAL | 0 refills | Status: AC | PRN
  Filled 2022-10-12: qty 28, 7d supply, fill #0

## 2022-10-13 ENCOUNTER — Other Ambulatory Visit (HOSPITAL_COMMUNITY): Payer: Self-pay

## 2022-10-18 ENCOUNTER — Other Ambulatory Visit (HOSPITAL_COMMUNITY): Payer: Self-pay

## 2022-10-18 MED ORDER — OXYCODONE-ACETAMINOPHEN 5-325 MG PO TABS
ORAL_TABLET | ORAL | 0 refills | Status: AC
Start: 2022-10-18 — End: ?
  Filled 2022-10-18: qty 150, 30d supply, fill #0

## 2022-10-18 MED ORDER — VITAMIN D (ERGOCALCIFEROL) 1.25 MG (50000 UNIT) PO CAPS
50000.0000 [IU] | ORAL_CAPSULE | ORAL | 1 refills | Status: AC
  Filled 2022-10-18: qty 12, 84d supply, fill #0

## 2022-10-18 MED ORDER — METHOCARBAMOL 500 MG PO TABS
ORAL_TABLET | ORAL | 0 refills | Status: AC
  Filled 2022-10-18: qty 90, 30d supply, fill #0

## 2022-10-18 MED ORDER — GABAPENTIN 400 MG PO CAPS
400.0000 mg | ORAL_CAPSULE | Freq: Three times a day (TID) | ORAL | 0 refills | Status: DC
  Filled 2022-10-18: qty 90, 30d supply, fill #0

## 2022-10-19 ENCOUNTER — Other Ambulatory Visit (HOSPITAL_COMMUNITY): Payer: Self-pay

## 2022-11-16 ENCOUNTER — Other Ambulatory Visit (HOSPITAL_COMMUNITY): Payer: Self-pay

## 2022-11-16 MED ORDER — OXYCODONE-ACETAMINOPHEN 5-325 MG PO TABS
1.0000 | ORAL_TABLET | Freq: Every day | ORAL | 0 refills | Status: AC
  Filled 2022-11-16: qty 180, 30d supply, fill #0

## 2022-11-16 MED ORDER — METHOCARBAMOL 500 MG PO TABS
250.0000 mg | ORAL_TABLET | Freq: Three times a day (TID) | ORAL | 0 refills | Status: AC | PRN
  Filled 2022-11-16: qty 75, 25d supply, fill #0

## 2022-11-16 MED ORDER — GABAPENTIN 400 MG PO CAPS
400.0000 mg | ORAL_CAPSULE | Freq: Three times a day (TID) | ORAL | 0 refills | Status: DC
  Filled 2022-11-16: qty 90, 30d supply, fill #0

## 2022-12-14 ENCOUNTER — Other Ambulatory Visit (HOSPITAL_COMMUNITY): Payer: Self-pay

## 2022-12-14 MED ORDER — OXYCODONE-ACETAMINOPHEN 5-325 MG PO TABS
1.0000 | ORAL_TABLET | ORAL | 0 refills | Status: AC
Start: 2022-12-14 — End: ?
  Filled 2022-12-14: qty 180, 30d supply, fill #0

## 2022-12-14 MED ORDER — GABAPENTIN 400 MG PO CAPS
400.0000 mg | ORAL_CAPSULE | Freq: Three times a day (TID) | ORAL | 0 refills | Status: AC
  Filled 2022-12-14: qty 90, 30d supply, fill #0

## 2022-12-14 MED ORDER — METHOCARBAMOL 500 MG PO TABS
250.0000 mg | ORAL_TABLET | Freq: Three times a day (TID) | ORAL | 0 refills | Status: AC | PRN
  Filled 2022-12-14: qty 90, 30d supply, fill #0

## 2022-12-30 ENCOUNTER — Other Ambulatory Visit (HOSPITAL_COMMUNITY): Payer: Self-pay

## 2022-12-30 MED ORDER — ROSUVASTATIN CALCIUM 10 MG PO TABS
10.0000 mg | ORAL_TABLET | Freq: Every day | ORAL | 2 refills | Status: AC
  Filled 2022-12-30: qty 30, 30d supply, fill #0

## 2022-12-31 ENCOUNTER — Other Ambulatory Visit (HOSPITAL_COMMUNITY): Payer: Self-pay

## 2023-01-06 ENCOUNTER — Other Ambulatory Visit (HOSPITAL_COMMUNITY): Payer: Self-pay

## 2023-01-11 ENCOUNTER — Other Ambulatory Visit (HOSPITAL_COMMUNITY): Payer: Self-pay

## 2023-01-11 MED ORDER — METHOCARBAMOL 750 MG PO TABS
375.0000 mg | ORAL_TABLET | Freq: Three times a day (TID) | ORAL | 0 refills | Status: AC | PRN
  Filled 2023-01-11: qty 90, 30d supply, fill #0

## 2023-01-11 MED ORDER — GABAPENTIN 600 MG PO TABS
600.0000 mg | ORAL_TABLET | Freq: Three times a day (TID) | ORAL | 0 refills | Status: AC
  Filled 2023-01-11: qty 90, 30d supply, fill #0

## 2023-01-11 MED ORDER — OXYCODONE-ACETAMINOPHEN 5-325 MG PO TABS
1.0000 | ORAL_TABLET | Freq: Four times a day (QID) | ORAL | 0 refills | Status: AC | PRN
  Filled 2023-01-11: qty 180, 45d supply, fill #0

## 2023-01-12 ENCOUNTER — Other Ambulatory Visit: Payer: Self-pay

## 2023-02-08 ENCOUNTER — Other Ambulatory Visit (HOSPITAL_COMMUNITY): Payer: Self-pay

## 2023-02-08 MED ORDER — OXYCODONE-ACETAMINOPHEN 10-325 MG PO TABS
1.0000 | ORAL_TABLET | Freq: Four times a day (QID) | ORAL | 0 refills | Status: DC | PRN
  Filled 2023-02-08: qty 120, 30d supply, fill #0

## 2023-02-08 MED ORDER — ERGOCALCIFEROL 1.25 MG (50000 UT) PO CAPS
50000.0000 [IU] | ORAL_CAPSULE | ORAL | 1 refills | Status: AC
  Filled 2023-02-08: qty 12, 84d supply, fill #0
  Filled 2023-04-05 – 2023-04-12 (×2): qty 12, 84d supply, fill #1

## 2023-02-08 MED ORDER — METHOCARBAMOL 500 MG PO TABS
500.0000 mg | ORAL_TABLET | Freq: Three times a day (TID) | ORAL | 0 refills | Status: AC | PRN
  Filled 2023-02-08: qty 180, 30d supply, fill #0

## 2023-02-08 MED ORDER — GABAPENTIN 800 MG PO TABS
800.0000 mg | ORAL_TABLET | Freq: Four times a day (QID) | ORAL | 0 refills | Status: DC
  Filled 2023-02-08: qty 120, 30d supply, fill #0

## 2023-03-08 ENCOUNTER — Other Ambulatory Visit (HOSPITAL_COMMUNITY): Payer: Self-pay

## 2023-03-08 MED ORDER — METHOCARBAMOL 500 MG PO TABS
500.0000 mg | ORAL_TABLET | Freq: Three times a day (TID) | ORAL | 0 refills | Status: AC | PRN
Start: 2023-03-08 — End: ?
  Filled 2023-03-08: qty 180, 30d supply, fill #0

## 2023-03-08 MED ORDER — NALOXONE HCL 4 MG/0.1ML NA LIQD
1.0000 | NASAL | 3 refills | Status: AC | PRN
  Filled 2023-03-08: qty 2, 1d supply, fill #0

## 2023-03-08 MED ORDER — OXYCODONE-ACETAMINOPHEN 10-325 MG PO TABS
1.0000 | ORAL_TABLET | Freq: Four times a day (QID) | ORAL | 0 refills | Status: DC | PRN
Start: 2023-03-08 — End: 2023-04-05
  Filled 2023-03-08: qty 120, 30d supply, fill #0

## 2023-03-08 MED ORDER — GABAPENTIN 800 MG PO TABS
800.0000 mg | ORAL_TABLET | Freq: Four times a day (QID) | ORAL | 0 refills | Status: DC
Start: 2023-03-08 — End: 2023-04-05
  Filled 2023-03-08: qty 120, 30d supply, fill #0

## 2023-03-10 ENCOUNTER — Other Ambulatory Visit (HOSPITAL_COMMUNITY): Payer: Self-pay

## 2023-03-10 MED ORDER — METHOCARBAMOL 500 MG PO TABS
1000.0000 mg | ORAL_TABLET | Freq: Three times a day (TID) | ORAL | 0 refills | Status: AC | PRN
  Filled 2023-03-10: qty 180, 30d supply, fill #0

## 2023-03-20 ENCOUNTER — Other Ambulatory Visit (HOSPITAL_COMMUNITY): Payer: Self-pay

## 2023-04-05 ENCOUNTER — Other Ambulatory Visit (HOSPITAL_COMMUNITY): Payer: Self-pay

## 2023-04-05 MED ORDER — ERGOCALCIFEROL 1.25 MG (50000 UT) PO CAPS
50000.0000 [IU] | ORAL_CAPSULE | ORAL | 1 refills | Status: AC
  Filled 2023-04-05: qty 12, 84d supply, fill #0

## 2023-04-05 MED ORDER — METHOCARBAMOL 500 MG PO TABS
1000.0000 mg | ORAL_TABLET | Freq: Three times a day (TID) | ORAL | 0 refills | Status: AC | PRN
  Filled 2023-04-05: qty 180, 30d supply, fill #0

## 2023-04-05 MED ORDER — GABAPENTIN 800 MG PO TABS
800.0000 mg | ORAL_TABLET | Freq: Four times a day (QID) | ORAL | 0 refills | Status: DC
  Filled 2023-04-05: qty 120, 30d supply, fill #0

## 2023-04-05 MED ORDER — OXYCODONE-ACETAMINOPHEN 10-325 MG PO TABS
1.0000 | ORAL_TABLET | Freq: Four times a day (QID) | ORAL | 0 refills | Status: DC | PRN
  Filled 2023-04-05: qty 120, 30d supply, fill #0

## 2023-04-26 ENCOUNTER — Other Ambulatory Visit (HOSPITAL_COMMUNITY): Payer: Self-pay

## 2023-05-03 ENCOUNTER — Other Ambulatory Visit (HOSPITAL_COMMUNITY): Payer: Self-pay

## 2023-05-03 MED ORDER — GABAPENTIN 800 MG PO TABS
800.0000 mg | ORAL_TABLET | Freq: Four times a day (QID) | ORAL | 0 refills | Status: AC
  Filled 2023-05-03: qty 28, 7d supply, fill #0

## 2023-05-03 MED ORDER — OXYCODONE-ACETAMINOPHEN 10-325 MG PO TABS
1.0000 | ORAL_TABLET | Freq: Four times a day (QID) | ORAL | 0 refills | Status: DC | PRN
Start: 2023-05-03 — End: 2023-05-10
  Filled 2023-05-03: qty 28, 7d supply, fill #0

## 2023-05-03 MED ORDER — METHOCARBAMOL 500 MG PO TABS
1000.0000 mg | ORAL_TABLET | Freq: Three times a day (TID) | ORAL | 0 refills | Status: AC | PRN
  Filled 2023-05-03: qty 42, 7d supply, fill #0

## 2023-05-04 ENCOUNTER — Other Ambulatory Visit (HOSPITAL_COMMUNITY): Payer: Self-pay

## 2023-05-10 ENCOUNTER — Other Ambulatory Visit (HOSPITAL_COMMUNITY): Payer: Self-pay

## 2023-05-10 MED ORDER — OXYCODONE-ACETAMINOPHEN 10-325 MG PO TABS
1.0000 | ORAL_TABLET | Freq: Four times a day (QID) | ORAL | 0 refills | Status: DC | PRN
  Filled 2023-05-10: qty 28, 7d supply, fill #0

## 2023-05-17 ENCOUNTER — Other Ambulatory Visit (HOSPITAL_COMMUNITY): Payer: Self-pay

## 2023-05-17 MED ORDER — OXYCODONE-ACETAMINOPHEN 10-325 MG PO TABS
1.0000 | ORAL_TABLET | Freq: Four times a day (QID) | ORAL | 0 refills | Status: AC | PRN
  Filled 2023-05-22: qty 28, 7d supply, fill #0

## 2023-05-17 MED ORDER — ERGOCALCIFEROL 1.25 MG (50000 UT) PO CAPS
50000.0000 [IU] | ORAL_CAPSULE | ORAL | 1 refills | Status: AC
  Filled 2023-05-17: qty 12, 84d supply, fill #0

## 2023-05-18 ENCOUNTER — Other Ambulatory Visit (HOSPITAL_COMMUNITY): Payer: Self-pay

## 2023-05-19 ENCOUNTER — Other Ambulatory Visit (HOSPITAL_COMMUNITY): Payer: Self-pay

## 2023-05-22 ENCOUNTER — Other Ambulatory Visit (HOSPITAL_COMMUNITY): Payer: Self-pay
